# Patient Record
Sex: Male | Born: 1945 | Race: White | Hispanic: No | Smoking: Former smoker
Health system: Southern US, Community
[De-identification: ages and names within clinical notes are randomized; demographics above are authoritative.]

## PROBLEM LIST (undated history)

## (undated) DIAGNOSIS — K429 Umbilical hernia without obstruction or gangrene: Secondary | ICD-10-CM

## (undated) DIAGNOSIS — F32A Depression, unspecified: Secondary | ICD-10-CM

## (undated) HISTORY — DX: Umbilical hernia without obstruction or gangrene: K42.9

## (undated) HISTORY — DX: Depression, unspecified: F32.A

---
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
## 1996-07-02 HISTORY — PX: UMBILICAL HERNIA REPAIR: SHX196

## 2000-07-02 HISTORY — PX: OTHER SURGICAL HISTORY: SHX169

## 2001-09-16 ENCOUNTER — Ambulatory Visit (HOSPITAL_COMMUNITY): Admission: RE | Admit: 2001-09-16 | Discharge: 2001-09-16 | Payer: Self-pay | Admitting: Family Medicine

## 2001-09-16 ENCOUNTER — Encounter: Payer: Self-pay | Admitting: Family Medicine

## 2003-10-15 ENCOUNTER — Ambulatory Visit (HOSPITAL_COMMUNITY): Admission: RE | Admit: 2003-10-15 | Discharge: 2003-10-15 | Payer: Self-pay | Admitting: Gastroenterology

## 2003-10-15 ENCOUNTER — Encounter (INDEPENDENT_AMBULATORY_CARE_PROVIDER_SITE_OTHER): Payer: Self-pay | Admitting: *Deleted

## 2009-03-15 ENCOUNTER — Encounter: Admission: RE | Admit: 2009-03-15 | Discharge: 2009-03-15 | Payer: Self-pay | Admitting: General Surgery

## 2009-04-15 HISTORY — PX: INGUINAL HERNIA REPAIR: SUR1180

## 2010-07-02 HISTORY — PX: CATARACT EXTRACTION, BILATERAL: SHX1313

## 2010-11-17 NOTE — Op Note (Signed)
NAME:  George Paul                         ACCOUNT NO.:  [id number]   MEDICAL RECORD NO.:  [id number]                   PATIENT TYPE:  AMB   LOCATION:  ENDO                                 FACILITY:  [facility]   PHYSICIAN:  Graylin Shiver, M.D.                DATE OF BIRTH:  02-06-1946   DATE OF PROCEDURE:  10/15/2003  DATE OF DISCHARGE:                                 OPERATIVE REPORT   PROCEDURE:  Colonoscopy.   INDICATIONS FOR PROCEDURE:  Screening.   Informed consent was obtained after explanation of the risks of bleeding,  infection, and perforation.   PREMEDICATION:  Fentanyl 50 mcg IV, Versed 6 mg IV.   PROCEDURE:  With the patient in the left lateral decubitus position, a  rectal exam was performed.  No masses were felt.  The Olympus colonoscope  was inserted into the rectum and advanced around the colon to the cecum.  Cecal landmarks were identified.  The cecum and ascending colon were normal.  The transverse colon normal.  The descending colon and sigmoid were normal.  The rectum showed a small 4 mm sessile polyp biopsied off with cold forceps.  He tolerated the procedure well without complications.   IMPRESSION:  Small rectal polyp.   PLAN:  The pathology will be checked.                                               Graylin Shiver, M.D.    Germain Osgood  D:  10/15/2003  T:  10/15/2003  Job:  [id number]   cc:   Meredith Staggers, M.D.  [address], [address]  [place]  [place] [address]  Fax: [phone]

## 2011-01-09 ENCOUNTER — Other Ambulatory Visit (INDEPENDENT_AMBULATORY_CARE_PROVIDER_SITE_OTHER): Payer: Self-pay | Admitting: General Surgery

## 2011-01-09 ENCOUNTER — Encounter (HOSPITAL_COMMUNITY): Payer: Federal, State, Local not specified - PPO

## 2011-01-09 LAB — SURGICAL PCR SCREEN
MRSA, PCR: NEGATIVE
Staphylococcus aureus: NEGATIVE

## 2011-01-18 ENCOUNTER — Ambulatory Visit (HOSPITAL_COMMUNITY)
Admission: RE | Admit: 2011-01-18 | Discharge: 2011-01-18 | Disposition: A | Discharge status: Home / Self Care | Payer: Federal, State, Local not specified - PPO | Source: Ambulatory Visit | Attending: General Surgery | Admitting: General Surgery

## 2011-01-18 DIAGNOSIS — Z01812 Encounter for preprocedural laboratory examination: Secondary | ICD-10-CM | POA: Insufficient documentation

## 2011-01-18 DIAGNOSIS — K4090 Unilateral inguinal hernia, without obstruction or gangrene, not specified as recurrent: Secondary | ICD-10-CM

## 2011-01-18 HISTORY — PX: INGUINAL HERNIA REPAIR: SUR1180

## 2011-02-02 NOTE — Op Note (Signed)
NAMEKONRAD, DESAI            ACCOUNT NO.:  [id number]  MEDICAL RECORD NO.:  [id number]  LOCATION:  DAYL                         FACILITY:  [facility]  PHYSICIAN:  Mary Sella. Andrey Campanile, MD     DATE OF BIRTH:  05/28/1946  DATE OF PROCEDURE:  01/18/2011 DATE OF DISCHARGE:                              OPERATIVE REPORT   PREOPERATIVE DIAGNOSIS:  Right inguinal hernia.  POSTOPERATIVE DIAGNOSIS:  Right indirect inguinal hernia.  PROCEDURE:  Open repair of right indirect inguinal hernia with mesh.  SURGEON:  Mary Sella. Andrey Campanile, MD  ANESTHESIA:  General with LMA plus 30 cc of Exparel.  FINDINGS:  The patient had an indirect hernia.  The sac was reduced.  I used a 6 inch x 6 inch piece of Ultrapro mesh that was trimmed slightly in an overlay fashion.  ESTIMATED BLOOD LOSS:  Minimal.  INDICATIONS FOR PROCEDURE:  The patient is a gentleman that I repaired a left inguinal hernia on in 2010.  He came in to the office several months ago complaining of intermittent bulge in his right groin for several weeks.  The bulge was more noticeable after he stood on his feet while working.  He elected for surgical repair.  We discussed the risks and benefits of surgery including but not limited to bleeding, infection, injury to surrounding structures, nerve entrapment, urinary retention, hernia recurrence, hematoma or seroma formation, urinary retention, chronic inguinal pain, mesh complications and need for additional procedures.  The patient would like to proceed with surgery.  DESCRIPTION OF PROCEDURE:  The patient's right groin was marked in the holding bay with the patient confirming the operative site.  He was then taken to the operating room where general LMA anesthesia was established.  His lower abdomen and groins were prepped and draped in usual standard surgical fashion.  He received Ancef prior to skin incision.  A surgical time-out was performed.  His abdomen was prepped with ChloraPrep.  I  drew a 6 cm skin incision in his right groin in an oblique fashion starting 1 fingerbreadth above the pubic tubercle and extending in an oblique fashion laterally towards the anterior superior iliac spine matching his contralateral incision.  I injected Exparel in the deep dermis, then incised with the #10 blade.  The subcutaneous tissue was divided with electrocautery.  Scarpa fascia was divided with electrocautery.  The external oblique aponeurosis was exposed.  I found the defect in the ring medially.  I then made a small slit  in the external oblique aponeurosis, then I opened up this with metz  Hemostats were placed on each edge of the external oblique aponeurosis.  I placed a Gelpi underneath the external oblique aponeurosis to aid with retraction.  I lifted up the external oblique aponeurosis off the internal oblique superiorly.  This was also carried medially.  I identified the ilioinguinal nerve.  It was dissected off the external oblique aponeurosis and preserved.  Then using my index finger, I lifted up the cord structures and isolated them with a Penrose drain.  There was no evidence of a direct defect.  The inguinal floor was intact.  I then started inspecting the cord contents.  The testicular vessels and vas  deferens were identified and separated from the indirect hernia sac.  I stripped some of the surrounding cremasteric fibers away with both electrocautery as well as blunt dissection.  The indirect sac was isolated from the surrounding cord structures.  Once it was reduced all the way down to the ring, it was reduced back into the abdominal cavity.  At this point, I obtained a piece of Ethicon Ultrapro 6 inch x 6 inch mesh, trimmed it slightly so that it would form an apex medially.  I then sutured the medial aspect of the mesh to the pubic tubercle and then ran out a running suture inferior and lateral between the mesh and the shelving edge of the inguinal ligament.   This was carried out laterally.  I then placed interrupted 2-0 Prolene sutures thru  the mesh to the conjoint tendon medially and then to the internal oblique musculature superior and laterally.  I made a slit in the mesh to accommodate the cord structures.  The tails of the mesh were trimmed slightly and tucked underneath the external oblique aponeurosis.  I then reapproximated the tails of the ring and sutured it to the shelving edge of the inguinal ligament.  The mesh was well seated.  The wound was irrigated.  There was one area in the internal oblique musculature that had a little bit of active ooze.  I therefore used a figure-of-eight 3-0 Vicryl suture for hemostasis and there is no additional oozing.  I then injected some Exparel in the conjoint tendon and internal oblique musculature.  The external oblique aponeurosis was then reapproximated with running 2-0 Vicryl.  Scarpa fascia was then reapproximated with inverted interrupted 3-0 Vicryl sutures.  Additional Exparel was placed in the subcutaneous tissue.  The skin was reapproximated with running 4- 0 Monocryl in a subcuticular fashion followed by application of Dermabond.  All needle, instrument, and sponge counts were correct x2. There were no immediate complications.  The patient was extubated and taken to the recovery room in stable condition.     Mary Sella. Andrey Campanile, MD     EMW/MEDQ  D:  01/18/2011  T:  01/18/2011  Job:  [id number]  cc:   Tally Joe, M.D. Fax: [phone]  Electronically Signed by Gaynelle Adu M.D. on 02/02/2011 01:22:12 PM

## 2011-02-06 ENCOUNTER — Encounter (INDEPENDENT_AMBULATORY_CARE_PROVIDER_SITE_OTHER): Payer: Self-pay | Admitting: General Surgery

## 2011-02-07 ENCOUNTER — Encounter (INDEPENDENT_AMBULATORY_CARE_PROVIDER_SITE_OTHER): Payer: Self-pay | Admitting: General Surgery

## 2011-02-07 ENCOUNTER — Ambulatory Visit (INDEPENDENT_AMBULATORY_CARE_PROVIDER_SITE_OTHER): Payer: Federal, State, Local not specified - PPO | Admitting: General Surgery

## 2011-02-07 DIAGNOSIS — Z9889 Other specified postprocedural states: Secondary | ICD-10-CM

## 2011-02-07 DIAGNOSIS — Z8719 Personal history of other diseases of the digestive system: Secondary | ICD-10-CM

## 2011-02-07 DIAGNOSIS — Z09 Encounter for follow-up examination after completed treatment for conditions other than malignant neoplasm: Secondary | ICD-10-CM

## 2011-02-07 NOTE — Progress Notes (Signed)
Chief complaint: Postop  Procedure: Open right inguinal indirect hernia repair with mesh January 18, 2011  History of Present Ilness: 64 year old Caucasian male comes in today for his first postoperative visit. He states that he did well after surgery. He took the narcotics for several days. He mainly had discomfort on his right flank. He denies any fevers or chills. He did have one or 2 days of constipation. But after taking some MiraLax he had a bowel movement. He denies any numbness or tingling in his groin. He denies any problems with his incision. He denies any trouble urinating. He reports a good appetite. He denies any ongoing discomfort. He is no longer taking any narcotics.  Physical Exam: Well-developed well-nourished Caucasian male in no apparent distress Pulmonary-lungs clear to auscultation Cardiac-regular rate and rhythm Abdomen-soft, nontender, nondistended. GU-well healed right inguinal incision. No signs of cellulitis, induration, or hematoma. Left groin incision is well healed. Both testicles are descended. No scrotal masses. No signs of hernia recurrence.  Data reviewed: I reviewed my operative note from January 18, 2011.  He had an indirect hernia that we repaired using ultra Pro mesh  Assessment and plan: Status post open right indirect inguinal hernia repair with mesh doing well.  I think the patient is doing very well. He is several weeks out from surgery. I will return to full activities on September 4 which will be around 6 weeks after surgery.  I advised him not to lift anything greater than 15 pounds until after September 4.  I will see him in 6 weeks.

## 2011-02-07 NOTE — Patient Instructions (Signed)
Can resume full activities on Sept 4 2012

## 2011-03-21 ENCOUNTER — Ambulatory Visit (INDEPENDENT_AMBULATORY_CARE_PROVIDER_SITE_OTHER): Payer: Federal, State, Local not specified - PPO | Admitting: General Surgery

## 2011-03-21 ENCOUNTER — Encounter (INDEPENDENT_AMBULATORY_CARE_PROVIDER_SITE_OTHER): Payer: Self-pay | Admitting: General Surgery

## 2011-03-21 VITALS — BP 110/70 | HR 76 | Temp 97.9°F | Ht 73.0 in | Wt 168.0 lb

## 2011-03-21 DIAGNOSIS — Z09 Encounter for follow-up examination after completed treatment for conditions other than malignant neoplasm: Secondary | ICD-10-CM

## 2011-03-21 NOTE — Patient Instructions (Signed)
Call us if needed

## 2011-03-21 NOTE — Progress Notes (Signed)
Chief complaint: Postop  Procedure: Open right inguinal indirect hernia repair with mesh January 18, 2011  History of Present Ilness: 65 year old Caucasian male comes in today for his second postoperative visit. He states that he has been doing well.  He has returned to work. He denies any numbness or tingling in his groin. He denies any problems with his incision. He denies any trouble urinating. He reports a good appetite. He denies any ongoing discomfort. He is no longer taking any narcotics.  Physical Exam: Well-developed well-nourished Caucasian male in no apparent distress Pulmonary-lungs clear to auscultation Cardiac-regular rate and rhythm Abdomen-soft, nontender, nondistended. GU-well healed right inguinal incision. No signs of cellulitis, induration, or hematoma. Old Left groin incision is well healed. Both testicles are descended. No scrotal masses. No signs of hernia recurrence.  Data reviewed: I reviewed my operative note from July 19 and Feb 07 2011.  He had an indirect hernia that we repaired using ultra Pro mesh  Assessment and plan: Status post open right indirect inguinal hernia repair with mesh doing well.  I think the patient is doing very well. He  Has returned to full activities. I will see him on a prn basis

## 2011-10-18 ENCOUNTER — Encounter (INDEPENDENT_AMBULATORY_CARE_PROVIDER_SITE_OTHER): Payer: Federal, State, Local not specified - PPO | Admitting: Ophthalmology

## 2011-10-29 ENCOUNTER — Encounter (INDEPENDENT_AMBULATORY_CARE_PROVIDER_SITE_OTHER): Payer: Federal, State, Local not specified - PPO | Admitting: Ophthalmology

## 2011-10-29 DIAGNOSIS — H538 Other visual disturbances: Secondary | ICD-10-CM

## 2011-10-29 DIAGNOSIS — H26499 Other secondary cataract, unspecified eye: Secondary | ICD-10-CM

## 2011-10-29 DIAGNOSIS — H43819 Vitreous degeneration, unspecified eye: Secondary | ICD-10-CM

## 2012-04-30 ENCOUNTER — Ambulatory Visit (INDEPENDENT_AMBULATORY_CARE_PROVIDER_SITE_OTHER): Payer: Federal, State, Local not specified - PPO | Admitting: Ophthalmology

## 2012-04-30 DIAGNOSIS — H2700 Aphakia, unspecified eye: Secondary | ICD-10-CM

## 2012-04-30 DIAGNOSIS — H538 Other visual disturbances: Secondary | ICD-10-CM

## 2012-04-30 DIAGNOSIS — H4300 Vitreous prolapse, unspecified eye: Secondary | ICD-10-CM

## 2012-04-30 DIAGNOSIS — H43819 Vitreous degeneration, unspecified eye: Secondary | ICD-10-CM

## 2013-05-05 ENCOUNTER — Ambulatory Visit (INDEPENDENT_AMBULATORY_CARE_PROVIDER_SITE_OTHER): Payer: Federal, State, Local not specified - PPO | Admitting: Ophthalmology

## 2013-05-05 DIAGNOSIS — H4300 Vitreous prolapse, unspecified eye: Secondary | ICD-10-CM

## 2013-05-05 DIAGNOSIS — H538 Other visual disturbances: Secondary | ICD-10-CM

## 2013-05-05 DIAGNOSIS — H43819 Vitreous degeneration, unspecified eye: Secondary | ICD-10-CM

## 2013-10-10 ENCOUNTER — Ambulatory Visit: Payer: Federal, State, Local not specified - PPO

## 2013-10-10 ENCOUNTER — Ambulatory Visit (INDEPENDENT_AMBULATORY_CARE_PROVIDER_SITE_OTHER): Payer: Federal, State, Local not specified - PPO | Admitting: Physician Assistant

## 2013-10-10 VITALS — BP 132/64 | HR 69 | Temp 100.7°F | Resp 18 | Ht 71.0 in | Wt 175.0 lb

## 2013-10-10 DIAGNOSIS — J111 Influenza due to unidentified influenza virus with other respiratory manifestations: Secondary | ICD-10-CM

## 2013-10-10 DIAGNOSIS — J101 Influenza due to other identified influenza virus with other respiratory manifestations: Secondary | ICD-10-CM

## 2013-10-10 DIAGNOSIS — R0602 Shortness of breath: Secondary | ICD-10-CM

## 2013-10-10 DIAGNOSIS — R059 Cough, unspecified: Secondary | ICD-10-CM

## 2013-10-10 DIAGNOSIS — R05 Cough: Secondary | ICD-10-CM

## 2013-10-10 DIAGNOSIS — R509 Fever, unspecified: Secondary | ICD-10-CM

## 2013-10-10 LAB — POCT CBC
Granulocyte percent: 67.5 %G (ref 37–80)
HCT, POC: 43.9 % (ref 43.5–53.7)
Hemoglobin: 14.4 g/dL (ref 14.1–18.1)
Lymph, poc: 1 (ref 0.6–3.4)
MCH, POC: 30.8 pg (ref 27–31.2)
MCHC: 32.8 g/dL (ref 31.8–35.4)
MCV: 93.9 fL (ref 80–97)
MID (cbc): 0.3 (ref 0–0.9)
MPV: 10.7 fL (ref 0–99.8)
POC Granulocyte: 2.8 (ref 2–6.9)
POC LYMPH PERCENT: 25.6 %L (ref 10–50)
POC MID %: 6.9 %M (ref 0–12)
Platelet Count, POC: 102 10*3/uL — AB (ref 142–424)
RBC: 4.67 M/uL — AB (ref 4.69–6.13)
RDW, POC: 13.5 %
WBC: 4.1 10*3/uL — AB (ref 4.6–10.2)

## 2013-10-10 LAB — POCT INFLUENZA A/B
Influenza A, POC: NEGATIVE
Influenza B, POC: POSITIVE

## 2013-10-10 MED ORDER — HYDROCOD POLST-CHLORPHEN POLST 10-8 MG/5ML PO LQCR: 5.0000 mL | Freq: Two times a day (BID) | ORAL | Status: DC | PRN

## 2013-10-10 MED ORDER — E-Z SPACER DEVI: Status: DC

## 2013-10-10 MED ORDER — ALBUTEROL SULFATE HFA 108 (90 BASE) MCG/ACT IN AERS: 2.0000 | INHALATION_SPRAY | RESPIRATORY_TRACT | Status: DC | PRN

## 2013-10-10 MED ORDER — OSELTAMIVIR PHOSPHATE 75 MG PO CAPS: 75.0000 mg | ORAL_CAPSULE | Freq: Two times a day (BID) | ORAL | Status: DC

## 2013-10-10 NOTE — Patient Instructions (Signed)

## 2013-10-10 NOTE — Progress Notes (Signed)
Subjective:    Patient ID: George Priestly., male    DOB: August 20, 1945, 68 y.o.   MRN: [id number]  HPI Primary Physician: Sissy Hoff, MD  Chief Complaint: Cough and fever x 1 day  HPI: 68 y.o. male with history below presents with 1 day history of cough, nasal congestion, some chest congestion, rhinorrhea, sinus pressure, scratchy throat, fever, chills, fatigue, and myalgias. T max a little over 101 earlier this afternoon. Cough is the main symptom and is mildly productive of yellow sputum, but he has to cough quite hard to get that up. Some SOB. No wheezing. 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 Had a stomach bug earlier in the week, but felt better. No known sick contacts with similar symptoms. Has taken Mucinex and antipyretics. No history of PNA in him. Former smoker. He did receive an influenza vaccine this season. He also has had an pneumonia vaccine within the past year. Here with his wife.    Past Medical History  Diagnosis Date  . Cataract   . Inguinal hernia     bilateral  . Umbilical hernia      Home Meds: Prior to Admission medications   Medication Sig Start Date End Date Taking? Authorizing Provider  eszopiclone (LUNESTA) 2 MG TABS Take 3 mg by mouth at bedtime. Take immediately before bedtime   Yes Historical Provider, MD    Allergies: No Known Allergies  History   Social History  . Marital Status: Married    Spouse Name: N/A    Number of Children: N/A  . Years of Education: N/A   Occupational History  . Not on file.   Social History Main Topics  . Smoking status: Former Games developer  . Smokeless tobacco: Not on file  . Alcohol Use: 0.0 oz/week    3-4 Glasses of wine per week     Comment: socially  . Drug Use: No  . Sexual Activity: Not on file   Other Topics Concern  . Not on file   Social History Narrative  . No narrative on file     Review of Systems  Constitutional: Positive for fever, chills, appetite change and fatigue.       T max at  home "a little over 101." Pushing fluids.   HENT: Positive for congestion, hearing loss, rhinorrhea, sinus pressure, sneezing and sore throat. Negative for ear pain and postnasal drip.        Nasal congestion.  Feels like there is some chest congestion as well .  Scratchy throat.  Muffled hearing. Ears have been popping.  Some sneezing, "not a lot."  Respiratory: Positive for cough and shortness of breath. Negative for wheezing.        Cough is mildly productive of yellowish sputum. Cough is not associated with time of day.   Gastrointestinal: Negative for nausea, vomiting and diarrhea.  Musculoskeletal: Positive for myalgias.  Neurological: Positive for headaches.       Bilateral temples.       Objective:   Physical Exam  Physical Exam: Blood pressure 132/64, pulse 69, temperature 100.7 F (38.2 C), temperature source Oral, resp. rate 18, height 5\' 11"  (1.803 m), weight 175 lb (79.379 kg), SpO2 96.00%., Body mass index is 24.42 kg/(m^2). General: Well developed, well nourished, in no acute distress. Not toxic appearing.  Head: Normocephalic, atraumatic, eyes without discharge, sclera non-icteric, nares are without discharge. Bilateral auditory canals clear, TM's are without perforation, pearly grey and translucent with reflective cone of light  bilaterally. Oral cavity moist, posterior pharynx with post nasal drip. No exudate, erythema, or peritonsillar abscess. Uvula midline.   Neck: Supple. No thyromegaly. Full ROM. No lymphadenopathy. No nuchal rigidity. Lungs: Clear bilaterally to auscultation without wheezes, rales, or rhonchi. Breathing is unlabored. Heart: RRR with S1 S2. No murmurs, rubs, or gallops appreciated. Msk:  Strength and tone normal for age. Extremities/Skin: Warm and dry. No clubbing or cyanosis. No edema. No rashes or suspicious lesions. No erythema. Calves supple.  Neuro: Alert and oriented X 3. Moves all extremities spontaneously. Gait is normal. CNII-XII grossly  in tact. Psych:  Responds to questions appropriately with a normal affect.   CXR:  [facility] reading (PRIMARY) by  Dr. Katrinka Blazing. Negative.  Labs: Results for orders placed in visit on 10/10/13  POCT CBC      Result Value Ref Range   WBC 4.1 (*) 4.6 - 10.2 K/uL   Lymph, poc 1.0  0.6 - 3.4   POC LYMPH PERCENT 25.6  10 - 50 %L   MID (cbc) 0.3  0 - 0.9   POC MID % 6.9  0 - 12 %M   POC Granulocyte 2.8  2 - 6.9   Granulocyte percent 67.5  37 - 80 %G   RBC 4.67 (*) 4.69 - 6.13 M/uL   Hemoglobin 14.4  14.1 - 18.1 g/dL   HCT, POC 40.9  81.1 - 53.7 %   MCV 93.9  80 - 97 fL   MCH, POC 30.8  27 - 31.2 pg   MCHC 32.8  31.8 - 35.4 g/dL   RDW, POC 91.4     Platelet Count, POC 102 (*) 142 - 424 K/uL   MPV 10.7  0 - 99.8 fL  POCT INFLUENZA A/B      Result Value Ref Range   Influenza A, POC Negative     Influenza B, POC Positive        Assessment & Plan:  68 year old male with influenza B, fever, cough, and SOB -Tamiflu 75 mg 1 po bid #10 no RF -Proventil 2 puffs inhaled q 4-6 hours prn #1 no RF -Tussionex 1 tsp po q 12 hours prn cough #90 mL no RF, SED -Mucinex -Rest/fluids -Follow up mid week if still symptomatic, sooner if worse -RTC precautions   Eula Listen, MHS, PA-C [facility] [address] [place], [place] [address] [phone] [facility] 10/10/2013 6:26 PM

## 2014-05-10 ENCOUNTER — Ambulatory Visit (INDEPENDENT_AMBULATORY_CARE_PROVIDER_SITE_OTHER): Payer: Federal, State, Local not specified - PPO | Admitting: Ophthalmology

## 2014-05-10 DIAGNOSIS — H4302 Vitreous prolapse, left eye: Secondary | ICD-10-CM

## 2014-05-10 DIAGNOSIS — H43813 Vitreous degeneration, bilateral: Secondary | ICD-10-CM

## 2014-05-10 DIAGNOSIS — H3509 Other intraretinal microvascular abnormalities: Secondary | ICD-10-CM

## 2015-05-17 ENCOUNTER — Ambulatory Visit (INDEPENDENT_AMBULATORY_CARE_PROVIDER_SITE_OTHER): Payer: Federal, State, Local not specified - PPO | Admitting: Ophthalmology

## 2015-05-17 DIAGNOSIS — H3509 Other intraretinal microvascular abnormalities: Secondary | ICD-10-CM | POA: Diagnosis not present

## 2015-05-17 DIAGNOSIS — H538 Other visual disturbances: Secondary | ICD-10-CM

## 2015-05-17 DIAGNOSIS — H4302 Vitreous prolapse, left eye: Secondary | ICD-10-CM

## 2015-05-17 DIAGNOSIS — H43813 Vitreous degeneration, bilateral: Secondary | ICD-10-CM | POA: Diagnosis not present

## 2016-05-17 ENCOUNTER — Ambulatory Visit (INDEPENDENT_AMBULATORY_CARE_PROVIDER_SITE_OTHER): Payer: Federal, State, Local not specified - PPO | Admitting: Ophthalmology

## 2016-05-17 DIAGNOSIS — H538 Other visual disturbances: Secondary | ICD-10-CM | POA: Diagnosis not present

## 2016-05-17 DIAGNOSIS — H43813 Vitreous degeneration, bilateral: Secondary | ICD-10-CM

## 2017-05-17 ENCOUNTER — Ambulatory Visit (INDEPENDENT_AMBULATORY_CARE_PROVIDER_SITE_OTHER): Payer: Federal, State, Local not specified - PPO | Admitting: Ophthalmology

## 2017-05-20 ENCOUNTER — Ambulatory Visit (INDEPENDENT_AMBULATORY_CARE_PROVIDER_SITE_OTHER): Payer: Federal, State, Local not specified - PPO | Admitting: Ophthalmology

## 2017-05-20 DIAGNOSIS — H353111 Nonexudative age-related macular degeneration, right eye, early dry stage: Secondary | ICD-10-CM

## 2017-05-20 DIAGNOSIS — H43813 Vitreous degeneration, bilateral: Secondary | ICD-10-CM | POA: Diagnosis not present

## 2018-05-21 ENCOUNTER — Encounter (INDEPENDENT_AMBULATORY_CARE_PROVIDER_SITE_OTHER): Payer: Federal, State, Local not specified - PPO | Admitting: Ophthalmology

## 2018-05-21 DIAGNOSIS — H353131 Nonexudative age-related macular degeneration, bilateral, early dry stage: Secondary | ICD-10-CM

## 2018-05-21 DIAGNOSIS — H43813 Vitreous degeneration, bilateral: Secondary | ICD-10-CM

## 2019-03-05 ENCOUNTER — Other Ambulatory Visit: Payer: Self-pay

## 2019-03-05 DIAGNOSIS — Z20822 Contact with and (suspected) exposure to covid-19: Secondary | ICD-10-CM

## 2019-03-06 LAB — NOVEL CORONAVIRUS, NAA: SARS-CoV-2, NAA: NOT DETECTED

## 2019-05-25 ENCOUNTER — Encounter (INDEPENDENT_AMBULATORY_CARE_PROVIDER_SITE_OTHER): Payer: Federal, State, Local not specified - PPO | Admitting: Ophthalmology

## 2019-05-25 ENCOUNTER — Other Ambulatory Visit: Payer: Self-pay

## 2019-05-25 DIAGNOSIS — H353131 Nonexudative age-related macular degeneration, bilateral, early dry stage: Secondary | ICD-10-CM | POA: Diagnosis not present

## 2019-05-25 DIAGNOSIS — H43813 Vitreous degeneration, bilateral: Secondary | ICD-10-CM | POA: Diagnosis not present

## 2019-05-25 DIAGNOSIS — H2513 Age-related nuclear cataract, bilateral: Secondary | ICD-10-CM

## 2019-06-22 ENCOUNTER — Emergency Department (HOSPITAL_COMMUNITY): Payer: Medicare Other

## 2019-06-22 ENCOUNTER — Inpatient Hospital Stay (HOSPITAL_COMMUNITY)
Admission: EM | Admit: 2019-06-22 | Discharge: 2019-06-25 | DRG: 193 | Disposition: A | Discharge status: Home / Self Care | Payer: Medicare Other | Attending: Family Medicine | Admitting: Family Medicine

## 2019-06-22 ENCOUNTER — Encounter (HOSPITAL_COMMUNITY): Payer: Self-pay

## 2019-06-22 DIAGNOSIS — R059 Cough, unspecified: Secondary | ICD-10-CM

## 2019-06-22 DIAGNOSIS — J189 Pneumonia, unspecified organism: Secondary | ICD-10-CM | POA: Diagnosis not present

## 2019-06-22 DIAGNOSIS — R509 Fever, unspecified: Secondary | ICD-10-CM

## 2019-06-22 DIAGNOSIS — R05 Cough: Secondary | ICD-10-CM | POA: Diagnosis present

## 2019-06-22 DIAGNOSIS — G4700 Insomnia, unspecified: Secondary | ICD-10-CM | POA: Diagnosis present

## 2019-06-22 DIAGNOSIS — Z79899 Other long term (current) drug therapy: Secondary | ICD-10-CM

## 2019-06-22 DIAGNOSIS — Z20828 Contact with and (suspected) exposure to other viral communicable diseases: Secondary | ICD-10-CM | POA: Diagnosis present

## 2019-06-22 DIAGNOSIS — R0602 Shortness of breath: Secondary | ICD-10-CM

## 2019-06-22 DIAGNOSIS — J9601 Acute respiratory failure with hypoxia: Secondary | ICD-10-CM | POA: Diagnosis present

## 2019-06-22 DIAGNOSIS — Z87891 Personal history of nicotine dependence: Secondary | ICD-10-CM

## 2019-06-22 LAB — CBC WITH DIFFERENTIAL/PLATELET
Abs Immature Granulocytes: 0.17 10*3/uL — ABNORMAL HIGH (ref 0.00–0.07)
Basophils Absolute: 0 10*3/uL (ref 0.0–0.1)
Basophils Relative: 0 %
Eosinophils Absolute: 0.1 10*3/uL (ref 0.0–0.5)
Eosinophils Relative: 1 %
HCT: 48.2 % (ref 39.0–52.0)
Hemoglobin: 16.1 g/dL (ref 13.0–17.0)
Immature Granulocytes: 1 %
Lymphocytes Relative: 2 %
Lymphs Abs: 0.4 10*3/uL — ABNORMAL LOW (ref 0.7–4.0)
MCH: 31.3 pg (ref 26.0–34.0)
MCHC: 33.4 g/dL (ref 30.0–36.0)
MCV: 93.6 fL (ref 80.0–100.0)
Monocytes Absolute: 1.7 10*3/uL — ABNORMAL HIGH (ref 0.1–1.0)
Monocytes Relative: 7 %
Neutro Abs: 20.1 10*3/uL — ABNORMAL HIGH (ref 1.7–7.7)
Neutrophils Relative %: 89 %
Platelets: 141 10*3/uL — ABNORMAL LOW (ref 150–400)
RBC: 5.15 MIL/uL (ref 4.22–5.81)
RDW: 12.7 % (ref 11.5–15.5)
WBC: 22.6 10*3/uL — ABNORMAL HIGH (ref 4.0–10.5)
nRBC: 0 % (ref 0.0–0.2)

## 2019-06-22 LAB — COMPREHENSIVE METABOLIC PANEL
ALT: 16 U/L (ref 0–44)
AST: 26 U/L (ref 15–41)
Albumin: 4.3 g/dL (ref 3.5–5.0)
Alkaline Phosphatase: 101 U/L (ref 38–126)
Anion gap: 10 (ref 5–15)
BUN: 14 mg/dL (ref 8–23)
CO2: 23 mmol/L (ref 22–32)
Calcium: 9.3 mg/dL (ref 8.9–10.3)
Chloride: 104 mmol/L (ref 98–111)
Creatinine, Ser: 0.86 mg/dL (ref 0.61–1.24)
GFR calc Af Amer: >60 mL/min (ref >60)
GFR calc non Af Amer: >60 mL/min (ref >60)
Glucose, Bld: 138 mg/dL — ABNORMAL HIGH (ref 70–99)
Potassium: 3.8 mmol/L (ref 3.5–5.1)
Sodium: 137 mmol/L (ref 135–145)
Total Bilirubin: 2.3 mg/dL — ABNORMAL HIGH (ref 0.3–1.2)
Total Protein: 7.2 g/dL (ref 6.5–8.1)

## 2019-06-22 LAB — LACTIC ACID, PLASMA: Lactic Acid, Venous: 1.6 mmol/L (ref 0.5–1.9)

## 2019-06-22 LAB — LIPASE, BLOOD: Lipase: 28 U/L (ref 11–51)

## 2019-06-22 LAB — POC SARS CORONAVIRUS 2 AG -  ED: SARS Coronavirus 2 Ag: NEGATIVE

## 2019-06-22 LAB — TROPONIN I (HIGH SENSITIVITY): Troponin I (High Sensitivity): 8 ng/L (ref <18)

## 2019-06-22 NOTE — ED Triage Notes (Signed)
Pt states his fever and body aches started about two hours ago after work, EMS gave 1000mg  tylenol, fever was 104 at home

## 2019-06-22 NOTE — ED Triage Notes (Signed)
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

## 2019-06-22 NOTE — ED Provider Notes (Signed)
[facility]-EMERGENCY DEPT Provider Note   CSN: [id number] Arrival date & time: 06/22/19  2100     History No chief complaint on file. SOB  George Priestlyarnest N Veley Jr. is a 73 y.o. male.  The history is provided by the patient and medical records. No language interpreter was used.  Cough Cough characteristics:  Non-productive Severity:  Severe Onset quality:  Gradual Duration:  3 days Timing:  Constant Progression:  Worsening Chronicity:  New Relieved by:  Nothing Worsened by:  Nothing Ineffective treatments:  None tried Associated symptoms: chills, fever, myalgias and shortness of breath   Associated symptoms: no chest pain, no diaphoresis, no headaches, no rash and no wheezing   Fever:    Timing:  Constant   Max temp PTA:  104   Temp source:  Oral   Progression:  Waxing and waning      Past Medical History:  Diagnosis Date  . Cataract   . Inguinal hernia    bilateral  . Umbilical hernia     There are no problems to display for this patient.   Past Surgical History:  Procedure Laterality Date  . arm surgery  2002  . CATARACT EXTRACTION, BILATERAL  2012  . EYE SURGERY  1964  . INGUINAL HERNIA REPAIR  04/15/09   left indirect  . INGUINAL HERNIA REPAIR  01/18/11   right indirect  . TONSILLECTOMY  1959  . UMBILICAL HERNIA REPAIR  1998       Family History  Problem Relation Age of Onset  . Heart disease Mother     Social History   Tobacco Use  . Smoking status: Former Games developermoker  . Smokeless tobacco: Never Used  Substance Use Topics  . Alcohol use: Yes    Alcohol/week: 3.0 - 4.0 standard drinks    Types: 3 - 4 Glasses of wine per week    Comment: socially  . Drug use: No    Home Medications Prior to Admission medications   Medication Sig Start Date End Date Taking? Authorizing Provider  albuterol (PROVENTIL HFA;VENTOLIN HFA) 108 (90 BASE) MCG/ACT inhaler Inhale 2 puffs into the lungs every 4 (four) hours as needed for wheezing  or shortness of breath. 10/10/13   Dunn, Raymon Muttonyan M, PA-C  chlorpheniramine-HYDROcodone (TUSSIONEX PENNKINETIC ER) 10-8 MG/5ML LQCR Take 5 mLs by mouth every 12 (twelve) hours as needed for cough. 10/10/13   Dunn, Raymon Muttonyan M, PA-C  eszopiclone (LUNESTA) 2 MG TABS Take 3 mg by mouth at bedtime. Take immediately before bedtime    [provider]  oseltamivir (TAMIFLU) 75 MG capsule Take 1 capsule (75 mg total) by mouth 2 (two) times daily. 10/10/13   Sondra Bargesunn, Ryan M, PA-C  Spacer/Aero-Holding Chambers (E-Z SPACER) inhaler Use as instructed 10/10/13   Sondra Bargesunn, Ryan M, PA-C    Allergies    Patient has no known allergies.  Review of Systems   Review of Systems  Constitutional: Positive for chills, fatigue and fever. Negative for diaphoresis.  HENT: Positive for congestion.   Respiratory: Positive for cough and shortness of breath. Negative for chest tightness, wheezing and stridor.   Cardiovascular: Negative for chest pain, palpitations and leg swelling.  Gastrointestinal: Positive for nausea. Negative for abdominal pain, diarrhea and vomiting.  Genitourinary: Negative for flank pain and frequency.  Musculoskeletal: Positive for myalgias. Negative for back pain and neck stiffness.  Skin: Negative for rash and wound.  Neurological: Negative for dizziness, light-headedness and headaches.  Psychiatric/Behavioral: Negative for agitation.  All other systems  reviewed and are negative.   Physical Exam Updated Vital Signs BP 138/88 (BP Location: Left Arm)   Pulse (!) 115   Temp 99.9 F (37.7 C) (Oral)   Resp 18   Ht 5\' 11"  (1.803 m)   Wt 86.2 kg   SpO2 92%   BMI 26.50 kg/m   Physical Exam Vitals and nursing note reviewed.  Constitutional:      General: He is not in acute distress.    Appearance: He is well-developed. He is ill-appearing. He is not toxic-appearing or diaphoretic.  HENT:     Head: Normocephalic and atraumatic.     Nose: Congestion present. No rhinorrhea.     Mouth/Throat:      Mouth: Mucous membranes are moist.  Eyes:     Conjunctiva/sclera: Conjunctivae normal.     Pupils: Pupils are equal, round, and reactive to light.  Cardiovascular:     Rate and Rhythm: Regular rhythm. Tachycardia present.     Pulses: Normal pulses.     Heart sounds: No murmur.  Pulmonary:     Effort: No respiratory distress.     Breath sounds: No stridor. Rhonchi present. No wheezing or rales.  Chest:     Chest wall: No tenderness.  Abdominal:     General: Abdomen is flat.     Palpations: Abdomen is soft.     Tenderness: There is no abdominal tenderness. There is no right CVA tenderness or left CVA tenderness.  Musculoskeletal:        General: No tenderness.     Cervical back: Neck supple. No tenderness.     Right lower leg: No edema.     Left lower leg: No edema.  Skin:    General: Skin is warm and dry.     Capillary Refill: Capillary refill takes less than 2 seconds.     Findings: No erythema.  Neurological:     General: No focal deficit present.     Mental Status: He is alert.     ED Results / Procedures / Treatments   Labs (all labs ordered are listed, but only abnormal results are displayed) Labs Reviewed  CBC WITH DIFFERENTIAL/PLATELET - Abnormal; Notable for the following components:      Result Value   WBC 22.6 (*)    Platelets 141 (*)    Neutro Abs 20.1 (*)    Lymphs Abs 0.4 (*)    Monocytes Absolute 1.7 (*)    Abs Immature Granulocytes 0.17 (*)    All other components within normal limits  COMPREHENSIVE METABOLIC PANEL - Abnormal; Notable for the following components:   Glucose, Bld 138 (*)    Total Bilirubin 2.3 (*)    All other components within normal limits  URINALYSIS, ROUTINE W REFLEX MICROSCOPIC - Abnormal; Notable for the following components:   Ketones, ur 20 (*)    All other components within normal limits  CULTURE, BLOOD (ROUTINE X 2)  CULTURE, BLOOD (ROUTINE X 2)  SARS CORONAVIRUS 2 (TAT 6-24 HRS)  URINE CULTURE  LACTIC ACID, PLASMA    LACTIC ACID, PLASMA  LIPASE, BLOOD  D-DIMER, QUANTITATIVE (NOT AT [facility])  POC SARS CORONAVIRUS 2 AG -  ED  TROPONIN I (HIGH SENSITIVITY)  TROPONIN I (HIGH SENSITIVITY)    EKG None  Radiology CT Angio Chest PE W and/or Wo Contrast  Result Date: 06/23/2019 CLINICAL DATA:  Shortness of breath, fever, body aches, leukocytosis, negative point of care COVID-19 testing EXAM: CT ANGIOGRAPHY CHEST WITH CONTRAST TECHNIQUE: Multidetector CT imaging  of the chest was performed using the standard protocol during bolus administration of intravenous contrast. Multiplanar CT image reconstructions and MIPs were obtained to evaluate the vascular anatomy. CONTRAST:  142mL OMNIPAQUE IOHEXOL 350 MG/ML SOLN COMPARISON:  Chest radiograph 06/22/2019 FINDINGS: Cardiovascular: Satisfactory opacification the pulmonary arteries. Evaluation beyond the lobar level is complicated by extensive respiratory motion artifact. No pulmonary artery filling defects are identified within the central or lobar pulmonary arteries. Central pulmonary arteries are borderline enlarged. There is mild cardiomegaly with predominantly right heart enlargement. Scant coronary calcification is present. Atherosclerotic plaque within the normal caliber aorta. Normal 3 vessel branching of the arch. Minimal calcification in the proximal left subclavian artery. Major venous structures are unremarkable. Mediastinum/Nodes: There is conglomerate nodal tissue in the subcarinal region extending to the right hilum which is low-attenuation a does measure up to 14 mm short axis (4/86). No acute abnormality of the esophagus. Bowing of the posterior trachea likely related to imaging during exhalation. Thyroid gland and thoracic inlet are unremarkable. Lungs/Pleura: Evaluation of the lung parenchyma is complicated by extensive respiratory motion artifact. There is some patchy airspace disease in the right lower lobe with peripheral ground-glass opacity as well as  peribronchovascular thickening and opacity extending from the right hilum. Several of the right lower lobe airways terminate in likely mucous filled bronchi. Additional atelectatic changes noted in the lung bases. No pneumothorax. No visible effusion. Upper Abdomen: A 9 mm hyperdense focus in the dome of the liver (4/99) is too small to fully characterize on CT imaging but statistically likely benign. No acute abnormalities present in the visualized portions of the upper abdomen. Musculoskeletal: Multilevel degenerative changes are present in the imaged portions of the spine. No acute osseous abnormality or suspicious osseous lesion. Review of the MIP images confirms the above findings. IMPRESSION: 1. Study is complicated by extensive respiratory motion artifact. 2. No evidence of pulmonary embolus within the central or lobar pulmonary arteries. 3. Airspace disease in the right lower lobe with peripheral ground-glass opacity as well as peribronchovascular thickening and opacity extending from the right hilum with mucous filled airways. Findings are favored to represent sequela of aspiration versus other acute infectious/inflammatory process, however, follow-up imaging after treatment of the acute symptoms is recommended to document resolution and exclude underlying malignancy. 4. Right hilar and subcarinal adenopathy, likely reactive. 5. 9 mm hyperdense focus in the dome of the liver is too small to fully characterize on CT imaging but statistically likely benign. Continued attention on follow-up imaging is recommended. 6.  Aortic Atherosclerosis (ICD10-I70.0). Electronically Signed   By: Lovena Le M.D.   On: 06/23/2019 02:38   DG Chest Portable 1 View  Result Date: 06/22/2019 CLINICAL DATA:  Shortness of breath, cough and fever EXAM: PORTABLE CHEST 1 VIEW COMPARISON:  Radiograph 06/30/2017 FINDINGS: Low lung volumes with streaky opacities in the bases favoring atelectatic change. No consolidative opacity,  features of edema, pneumothorax or effusion. The aorta is calcified. The remaining cardiomediastinal contours are unremarkable. No acute osseous or soft tissue abnormality. IMPRESSION: 1. Low lung volumes with streaky opacities in the bases favoring atelectasis. No other acute cardiopulmonary abnormality. 2.  Aortic Atherosclerosis (ICD10-I70.0). Electronically Signed   By: Lovena Le M.D.   On: 06/22/2019 23:19    Procedures Procedures (including critical care time)  Medications Ordered in ED Medications  sodium chloride (PF) 0.9 % injection (  Given by Other 06/23/19 0129)  iohexol (OMNIPAQUE) 350 MG/ML injection 100 mL (100 mLs Intravenous Contrast Given 06/23/19 0205)  ED Course  I have reviewed the triage vital signs and the nursing notes.  Pertinent labs & imaging results that were available during my care of the patient were reviewed by me and considered in my medical decision making (see chart for details).    MDM Rules/Calculators/A&P                      Tomi Paddock. is a 73 y.o. male with no significant past medical history who presents with fevers, chills, shortness of breath, nausea, and malaise.  Patient reports that he has "had a cold" for a few days.  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  He reports today he had worsening shortness of breath and cough and was found to have a temperature of 104 by EMS.  Patient was given Tylenol and temperature is improved to 99.9 orally.  Patient was tachycardic on arrival and denied any chest pain.  He does report he is very short of breath and oxygen saturation was around 92% on room air.  Patient increased work of breathing and was placed on oxygen supplementation and is now improved to around 94 to 96% on 2 L.  He is reporting some mild abdominal discomfort but denies any constipation or diarrhea.  No recent trauma.  He has significant cough but no wheezing.  He denies  any other complaints.  No neck pain or neck stiffness.  On exam, lungs are coarse bilaterally.  No significant wheezing.  No murmur.  Patient is good pulses in all extremities and had no significant lower extremity tenderness or edema.  Good pulses.  Chest was nontender.  Abdomen was nontender on my exam.  Clinically I am concerned about COVID-19 infection versus URI versus pneumonia.  With lack of chest pain and the context of his cough and fever, I have lower suspicion for pulmonary embolism at this time.  Will get chest x-ray, labs, and he will be on oxygen.  Anticipate admission for his increased work of breathing and borderline low oxygen saturations at rest.  Rapid covid test is negative but there is still a high suspiscion. Will send 6 hour test. Leukocytosis present.  Other initial labs reassuring.   Given low O2 with his fever, cough, SOB and being on O2 suppplementation, pt will be admitted. UA did not show UTI. Covid vs occult PNA primary concerns. Pt will be admitted for further management.      Final Clinical Impression(s) / ED Diagnoses Final diagnoses:  Shortness of breath  Cough  Fever, unspecified fever cause    Rx / DC Orders ED Discharge Orders    None     Clinical Impression: 1. Shortness of breath   2. Cough   3. Fever, unspecified fever cause     Disposition: Admit  This note was prepared with assistance of Dragon voice recognition software. Occasional wrong-word or sound-a-like substitutions may have occurred due to the inherent limitations of voice recognition software.      Kaydenn Mclear, Canary Brim, MD 06/23/19 [phone]

## 2019-06-23 ENCOUNTER — Observation Stay (HOSPITAL_COMMUNITY): Payer: Medicare Other

## 2019-06-23 ENCOUNTER — Encounter (HOSPITAL_COMMUNITY): Payer: Self-pay | Admitting: Internal Medicine

## 2019-06-23 ENCOUNTER — Other Ambulatory Visit: Payer: Self-pay

## 2019-06-23 DIAGNOSIS — Z20828 Contact with and (suspected) exposure to other viral communicable diseases: Secondary | ICD-10-CM | POA: Diagnosis present

## 2019-06-23 DIAGNOSIS — Z87891 Personal history of nicotine dependence: Secondary | ICD-10-CM | POA: Diagnosis not present

## 2019-06-23 DIAGNOSIS — R0602 Shortness of breath: Secondary | ICD-10-CM

## 2019-06-23 DIAGNOSIS — G4700 Insomnia, unspecified: Secondary | ICD-10-CM | POA: Diagnosis present

## 2019-06-23 DIAGNOSIS — R05 Cough: Secondary | ICD-10-CM | POA: Diagnosis present

## 2019-06-23 DIAGNOSIS — J189 Pneumonia, unspecified organism: Principal | ICD-10-CM

## 2019-06-23 DIAGNOSIS — Z79899 Other long term (current) drug therapy: Secondary | ICD-10-CM | POA: Diagnosis not present

## 2019-06-23 DIAGNOSIS — J9601 Acute respiratory failure with hypoxia: Secondary | ICD-10-CM | POA: Diagnosis present

## 2019-06-23 DIAGNOSIS — R059 Cough, unspecified: Secondary | ICD-10-CM | POA: Diagnosis present

## 2019-06-23 LAB — URINALYSIS, ROUTINE W REFLEX MICROSCOPIC
Bilirubin Urine: NEGATIVE
Glucose, UA: NEGATIVE mg/dL
Hgb urine dipstick: NEGATIVE
Ketones, ur: 20 mg/dL — AB
Leukocytes,Ua: NEGATIVE
Nitrite: NEGATIVE
Protein, ur: NEGATIVE mg/dL
Specific Gravity, Urine: 1.018 (ref 1.005–1.030)
pH: 6 (ref 5.0–8.0)

## 2019-06-23 LAB — SARS CORONAVIRUS 2 (TAT 6-24 HRS): SARS Coronavirus 2: NEGATIVE

## 2019-06-23 LAB — LACTIC ACID, PLASMA: Lactic Acid, Venous: 1.5 mmol/L (ref 0.5–1.9)

## 2019-06-23 LAB — STREP PNEUMONIAE URINARY ANTIGEN: Strep Pneumo Urinary Antigen: POSITIVE — AB

## 2019-06-23 LAB — HIV ANTIBODY (ROUTINE TESTING W REFLEX): HIV Screen 4th Generation wRfx: NONREACTIVE

## 2019-06-23 LAB — D-DIMER, QUANTITATIVE: D-Dimer, Quant: 0.37 ug/mL-FEU (ref 0.00–0.50)

## 2019-06-23 LAB — TROPONIN I (HIGH SENSITIVITY): Troponin I (High Sensitivity): 9 ng/L (ref <18)

## 2019-06-23 MED ORDER — ALBUTEROL SULFATE (2.5 MG/3ML) 0.083% IN NEBU: 2.5000 mg | INHALATION_SOLUTION | RESPIRATORY_TRACT | Status: DC | PRN

## 2019-06-23 MED ORDER — SODIUM CHLORIDE 0.9 % IV SOLN
500.0000 mg | Freq: Every day | INTRAVENOUS | Status: DC
  Administered 2019-06-23 – 2019-06-25 (×3): 500 mg via INTRAVENOUS
  Filled 2019-06-23 (×3): qty 500

## 2019-06-23 MED ORDER — SODIUM CHLORIDE 0.9 % IV SOLN: INTRAVENOUS | Status: AC

## 2019-06-23 MED ORDER — ZOLPIDEM TARTRATE 5 MG PO TABS: 5.0000 mg | ORAL_TABLET | Freq: Every evening | ORAL | Status: DC | PRN

## 2019-06-23 MED ORDER — IOHEXOL 350 MG/ML SOLN
100.0000 mL | Freq: Once | INTRAVENOUS | Status: AC | PRN
  Administered 2019-06-23: 100 mL via INTRAVENOUS

## 2019-06-23 MED ORDER — ALBUTEROL SULFATE HFA 108 (90 BASE) MCG/ACT IN AERS
2.0000 | INHALATION_SPRAY | RESPIRATORY_TRACT | Status: DC | PRN
  Filled 2019-06-23: qty 6.7

## 2019-06-23 MED ORDER — SODIUM CHLORIDE (PF) 0.9 % IJ SOLN
INTRAMUSCULAR | Status: AC
  Filled 2019-06-23: qty 50

## 2019-06-23 MED ORDER — SODIUM CHLORIDE 0.9 % IV SOLN
2.0000 g | Freq: Every day | INTRAVENOUS | Status: DC
  Administered 2019-06-23 – 2019-06-25 (×3): 2 g via INTRAVENOUS
  Filled 2019-06-23: qty 2
  Filled 2019-06-23: qty 20
  Filled 2019-06-23: qty 2

## 2019-06-23 MED ORDER — ENOXAPARIN SODIUM 40 MG/0.4ML ~~LOC~~ SOLN
40.0000 mg | Freq: Every day | SUBCUTANEOUS | Status: DC
  Administered 2019-06-23 – 2019-06-25 (×3): 40 mg via SUBCUTANEOUS
  Filled 2019-06-23 (×3): qty 0.4

## 2019-06-23 NOTE — H&P (Addendum)
TRH H&P    Patient Demographics:    George Paul, is a 73 y.o. male  MRN: [id number]  DOB - 1945/12/16  Admit Date - 06/22/2019  Referring MD/NP/PA: Wilkie Aye  Outpatient Primary MD for the patient is Tally Joe, MD  Patient coming from:  home  Chief complaint-   Fever,    HPI:    George Paul  is a 73 y.o. 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  pt also noted some myalgia.  Pt denies cp, palp, sob, n/v, diarrhea, brbpr, black stool, dysuria , hematuria. Pt notes it has been difficult to urinate over the past few days.   In Ed,  T 99.9, P 115, R 18, Bp 138/88 pox 92% on 2L [place] Wt 86.2kg  CTA chest IMPRESSION: 1. Study is complicated by extensive respiratory motion artifact. 2. No evidence of pulmonary embolus within the central or lobar pulmonary arteries. 3. Airspace disease in the right lower lobe with peripheral ground-glass opacity as well as peribronchovascular thickening and opacity extending from the right hilum with mucous filled airways. Findings are favored to represent sequela of aspiration versus other acute infectious/inflammatory process, however, follow-up imaging after treatment of the acute symptoms is recommended to document resolution and exclude underlying malignancy. 4. Right hilar and subcarinal adenopathy, likely reactive. 5. 9 mm hyperdense focus in the dome of the liver is too small to fully characterize on CT imaging but statistically likely benign. Continued attention on follow-up imaging is recommended. 6.  Aortic Atherosclerosis (ICD10-I70.0).   Wbc 22.6, Hgb 16.1, Plt 141 Na 137, K 3.8,  Bun 14, Creatinine 0.86 Ast 26, Alt 16 Lactic acid 1.6 Trop 8 D dimer 0.37 Urinalysis negative Poc sars negative  Blood culture x2 pending  Pt will be admitted for CAP r/o Covid-19     Review of systems:    In  addition to the HPI above,    No Headache, No changes with Vision or hearing, No problems swallowing food or Liquids, No Chest pain, No Shortness of Breath, No Abdominal pain, No Nausea or Vomiting, bowel movements are regular, No Blood in stool or Urine, No dysuria, No new skin rashes or bruises, No new joints pains-aches,  No new weakness, tingling, numbness in any extremity, No recent weight gain or loss, No polyuria, polydypsia or polyphagia, No significant Mental Stressors.  All other systems reviewed and are negative.    Past History of the following :    Past Medical History:  Diagnosis Date  . Cataract   . Inguinal hernia    bilateral  . Umbilical hernia       Past Surgical History:  Procedure Laterality Date  . arm surgery  2002  . CATARACT EXTRACTION, BILATERAL  2012  . EYE SURGERY  1964  . INGUINAL HERNIA REPAIR  04/15/09   left indirect  . INGUINAL HERNIA REPAIR  01/18/11   right indirect  . TONSILLECTOMY  1959  . UMBILICAL HERNIA REPAIR  1998      Social  History:      Social History   Tobacco Use  . Smoking status: Former Games developer  . Smokeless tobacco: Never Used  Substance Use Topics  . Alcohol use: Yes    Alcohol/week: 3.0 - 4.0 standard drinks    Types: 3 - 4 Glasses of wine per week    Comment: socially       Family History :     Family History  Problem Relation Age of Onset  . Heart disease Mother        Home Medications:   Prior to Admission medications   Medication Sig Start Date End Date Taking? Authorizing Provider  albuterol (PROVENTIL HFA;VENTOLIN HFA) 108 (90 BASE) MCG/ACT inhaler Inhale 2 puffs into the lungs every 4 (four) hours as needed for wheezing or shortness of breath. 10/10/13  Yes Dunn, Raymon Mutton, PA-C  eszopiclone (LUNESTA) 2 MG TABS Take 3 mg by mouth at bedtime. Take immediately before bedtime   Yes [provider]  mirtazapine (REMERON) 15 MG tablet Take 7.5 mg by mouth at bedtime. 04/06/19  Yes  [provider]  chlorpheniramine-HYDROcodone (TUSSIONEX PENNKINETIC ER) 10-8 MG/5ML LQCR Take 5 mLs by mouth every 12 (twelve) hours as needed for cough. Patient not taking: Reported on 06/23/2019 10/10/13   Sondra Barges, PA-C  oseltamivir (TAMIFLU) 75 MG capsule Take 1 capsule (75 mg total) by mouth 2 (two) times daily. Patient not taking: Reported on 06/23/2019 10/10/13   Sondra Barges, PA-C  Spacer/Aero-Holding Chambers (E-Z SPACER) inhaler Use as instructed 10/10/13   Sondra Barges, PA-C     Allergies:    No Known Allergies   Physical Exam:   Vitals  Blood pressure 136/75, pulse 93, temperature 99.9 F (37.7 C), temperature source Oral, resp. rate 20, height 5\' 11"  (1.803 m), weight 86.2 kg, SpO2 97 %.  1.  General: axoxo3  2. Psychiatric: euthymic  3. Neurologic: Cn 2-12 intact, reflexes 2+ symmetric, diffuse with no clonus, motor 5/5 in all 4 ext  4. HEENMT:  Anicteric, pupils 1.30mm symmetric, direct, consensual , near intact Neck: no jvd  5. Respiratory : Crackles right lung base, no wheezing  6. Cardiovascular : rrr s1, s2, no m/g/r  7. Gastrointestinal:  Abd: soft, nt, nd, +bs  8. Skin:  Ext: no c/c/e,  No rash  9.Musculoskeletal:  Good ROM    Data Review:    CBC Recent Labs  Lab 06/22/19 2232  WBC 22.6*  HGB 16.1  HCT 48.2  PLT 141*  MCV 93.6  MCH 31.3  MCHC 33.4  RDW 12.7  LYMPHSABS 0.4*  MONOABS 1.7*  EOSABS 0.1  BASOSABS 0.0   ------------------------------------------------------------------------------------------------------------------  Results for orders placed or performed during the hospital encounter of 06/22/19 (from the past 48 hour(s))  CBC with Differential     Status: Abnormal   Collection Time: 06/22/19 10:32 PM  Result Value Ref Range   WBC 22.6 (H) 4.0 - 10.5 K/uL   RBC 5.15 4.22 - 5.81 MIL/uL   Hemoglobin 16.1 13.0 - 17.0 g/dL   HCT 78.4 69.6 - 29.5 %   MCV 93.6 80.0 - 100.0 fL   MCH 31.3 26.0 - 34.0 pg     MCHC 33.4 30.0 - 36.0 g/dL   RDW 28.4 13.2 - 44.0 %   Platelets 141 (L) 150 - 400 K/uL   nRBC 0.0 0.0 - 0.2 %   Neutrophils Relative % 89 %   Neutro Abs 20.1 (H) 1.7 - 7.7 K/uL  Lymphocytes Relative 2 %   Lymphs Abs 0.4 (L) 0.7 - 4.0 K/uL   Monocytes Relative 7 %   Monocytes Absolute 1.7 (H) 0.1 - 1.0 K/uL   Eosinophils Relative 1 %   Eosinophils Absolute 0.1 0.0 - 0.5 K/uL   Basophils Relative 0 %   Basophils Absolute 0.0 0.0 - 0.1 K/uL   Immature Granulocytes 1 %   Abs Immature Granulocytes 0.17 (H) 0.00 - 0.07 K/uL    Comment: Performed at [facility], [address] [place], [place] [address]  Comprehensive metabolic panel     Status: Abnormal   Collection Time: 06/22/19 10:32 PM  Result Value Ref Range   Sodium 137 135 - 145 mmol/L   Potassium 3.8 3.5 - 5.1 mmol/L   Chloride 104 98 - 111 mmol/L   CO2 23 22 - 32 mmol/L   Glucose, Bld 138 (H) 70 - 99 mg/dL   BUN 14 8 - 23 mg/dL   Creatinine, Ser 6.96 0.61 - 1.24 mg/dL   Calcium 9.3 8.9 - 29.5 mg/dL   Total Protein 7.2 6.5 - 8.1 g/dL   Albumin 4.3 3.5 - 5.0 g/dL   AST 26 15 - 41 U/L   ALT 16 0 - 44 U/L   Alkaline Phosphatase 101 38 - 126 U/L   Total Bilirubin 2.3 (H) 0.3 - 1.2 mg/dL   GFR calc non Af Amer >60 >60 mL/min   GFR calc Af Amer >60 >60 mL/min   Anion gap 10 5 - 15    Comment: Performed at [facility], [address] [place], [place] [address]  Lactic acid, plasma     Status: None   Collection Time: 06/22/19 10:32 PM  Result Value Ref Range   Lactic Acid, Venous 1.6 0.5 - 1.9 mmol/L    Comment: Performed at [facility], [address] [place], [place] [address]  Troponin I (High Sensitivity)     Status: None   Collection Time: 06/22/19 10:32 PM  Result Value Ref Range   Troponin I (High Sensitivity) 8 <18 ng/L    Comment: (NOTE) Elevated high sensitivity troponin I (hsTnI) values and significant  changes across serial measurements may  suggest ACS but many other  chronic and acute conditions are known to elevate hsTnI results.  Refer to the "Links" section for chest pain algorithms and additional  guidance. Performed at [facility], [address], [place], [place] [address]   Lipase, blood     Status: None   Collection Time: 06/22/19 10:32 PM  Result Value Ref Range   Lipase 28 11 - 51 U/L    Comment: Performed at [facility], [address] [place], [place] [address]  POC SARS Coronavirus 2 Ag-ED - Nasal Swab (BD Veritor Kit)     Status: None   Collection Time: 06/22/19 11:22 PM  Result Value Ref Range   SARS Coronavirus 2 Ag NEGATIVE NEGATIVE    Comment: (NOTE) SARS-CoV-2 antigen NOT DETECTED.  Negative results are presumptive.  Negative results do not preclude SARS-CoV-2 infection and should not be used as the sole basis for treatment or other patient management decisions, including infection  control decisions, particularly in the presence of clinical signs and  symptoms consistent with COVID-19, or in those who have been in contact with the virus.  Negative results must be combined with clinical observations, patient history, and epidemiological information. The expected result is Negative. Fact Sheet for Patients: https://sanders-williams.net/  Fact Sheet for Healthcare Providers: https://martinez.com/ This test is not yet approved or cleared by the [place] FDA and  has been authorized for detection and/or diagnosis of SARS-CoV-2 by FDA under an Emergency Use Authorization (EUA).  This EUA will remain in effect (meaning this test can be used) for the duration of  the COVID-19 de claration under Section 564(b)(1) of the Act, 21 U.S.C. section 360bbb-3(b)(1), unless the authorization is terminated or revoked sooner.   Lactic acid, plasma     Status: None   Collection Time: 06/23/19  1:54 AM  Result Value Ref Range   Lactic  Acid, Venous 1.5 0.5 - 1.9 mmol/L    Comment: Performed at [facility], [address], [place], [place] [address]  Troponin I (High Sensitivity)     Status: None   Collection Time: 06/23/19  1:54 AM  Result Value Ref Range   Troponin I (High Sensitivity) 9 <18 ng/L    Comment: (NOTE) Elevated high sensitivity troponin I (hsTnI) values and significant  changes across serial measurements may suggest ACS but many other  chronic and acute conditions are known to elevate hsTnI results.  Refer to the "Links" section for chest pain algorithms and additional  guidance. Performed at [facility], [address] [place], [place] [address]   D-dimer, quantitative (not at [facility])     Status: None   Collection Time: 06/23/19  1:54 AM  Result Value Ref Range   D-Dimer, Quant 0.37 0.00 - 0.50 ug/mL-FEU    Comment: (NOTE) At the manufacturer cut-off of 0.50 ug/mL FEU, this assay has been documented to exclude PE with a sensitivity and negative predictive value of 97 to 99%.  At this time, this assay has not been approved by the FDA to exclude DVT/VTE. Results should be correlated with clinical presentation. Performed at [facility], [address] [place], [place] [address]   Urinalysis, Routine w reflex microscopic     Status: Abnormal   Collection Time: 06/23/19  1:54 AM  Result Value Ref Range   Color, Urine YELLOW YELLOW   APPearance CLEAR CLEAR   Specific Gravity, Urine 1.018 1.005 - 1.030   pH 6.0 5.0 - 8.0   Glucose, UA NEGATIVE NEGATIVE mg/dL   Hgb urine dipstick NEGATIVE NEGATIVE   Bilirubin Urine NEGATIVE NEGATIVE   Ketones, ur 20 (A) NEGATIVE mg/dL   Protein, ur NEGATIVE NEGATIVE mg/dL   Nitrite NEGATIVE NEGATIVE   Leukocytes,Ua NEGATIVE NEGATIVE    Comment: Performed at [facility], [address] [place], [place] [address]    Chemistries  Recent Labs  Lab 06/22/19 2232  NA 137  K 3.8  CL  104  CO2 23  GLUCOSE 138*  BUN 14  CREATININE 0.86  CALCIUM 9.3  AST 26  ALT 16  ALKPHOS 101  BILITOT 2.3*   ------------------------------------------------------------------------------------------------------------------  ------------------------------------------------------------------------------------------------------------------ GFR: Estimated Creatinine Clearance: 82.7 mL/min (by C-G formula based on SCr of 0.86 mg/dL). Liver Function Tests: Recent Labs  Lab 06/22/19 2232  AST 26  ALT 16  ALKPHOS 101  BILITOT 2.3*  PROT 7.2  ALBUMIN 4.3   Recent Labs  Lab 06/22/19 2232  LIPASE 28   No results for input(s): AMMONIA in the last 168 hours. Coagulation Profile: No results for input(s): INR, PROTIME in the last 168 hours. Cardiac Enzymes: No results for input(s): CKTOTAL, CKMB, CKMBINDEX, TROPONINI in the last 168 hours. BNP (last 3 results) No results for input(s): PROBNP in  the last 8760 hours. HbA1C: No results for input(s): HGBA1C in the last 72 hours. CBG: No results for input(s): GLUCAP in the last 168 hours. Lipid Profile: No results for input(s): CHOL, HDL, LDLCALC, TRIG, CHOLHDL, LDLDIRECT in the last 72 hours. Thyroid Function Tests: No results for input(s): TSH, T4TOTAL, FREET4, T3FREE, THYROIDAB in the last 72 hours. Anemia Panel: No results for input(s): VITAMINB12, FOLATE, FERRITIN, TIBC, IRON, RETICCTPCT in the last 72 hours.  --------------------------------------------------------------------------------------------------------------- Urine analysis:    Component Value Date/Time   COLORURINE YELLOW 06/23/2019 0154   APPEARANCEUR CLEAR 06/23/2019 0154   LABSPEC 1.018 06/23/2019 0154   PHURINE 6.0 06/23/2019 0154   GLUCOSEU NEGATIVE 06/23/2019 0154   HGBUR NEGATIVE 06/23/2019 0154   BILIRUBINUR NEGATIVE 06/23/2019 0154   KETONESUR 20 (A) 06/23/2019 0154   PROTEINUR NEGATIVE 06/23/2019 0154   NITRITE NEGATIVE 06/23/2019 0154    LEUKOCYTESUR NEGATIVE 06/23/2019 0154      Imaging Results:    CT Angio Chest PE W and/or Wo Contrast  Result Date: 06/23/2019 CLINICAL DATA:  Shortness of breath, fever, body aches, leukocytosis, negative point of care COVID-19 testing EXAM: CT ANGIOGRAPHY CHEST WITH CONTRAST TECHNIQUE: Multidetector CT imaging of the chest was performed using the standard protocol during bolus administration of intravenous contrast. Multiplanar CT image reconstructions and MIPs were obtained to evaluate the vascular anatomy. CONTRAST:  OMNIPAQUE IOHEXOL 350 MG/ML SOLN COMPARISON:  Chest radiograph 06/22/2019 FINDINGS: Cardiovascular: Satisfactory opacification the pulmonary arteries. Evaluation beyond the lobar level is complicated by extensive respiratory motion artifact. No pulmonary artery filling defects are identified within the central or lobar pulmonary arteries. Central pulmonary arteries are borderline enlarged. There is mild cardiomegaly with predominantly right heart enlargement. Scant coronary calcification is present. Atherosclerotic plaque within the normal caliber aorta. Normal 3 vessel branching of the arch. Minimal calcification in the proximal left subclavian artery. Major venous structures are unremarkable. Mediastinum/Nodes: There is conglomerate nodal tissue in the subcarinal region extending to the right hilum which is low-attenuation a does measure up to 14 mm short axis (4/86). No acute abnormality of the esophagus. Bowing of the posterior trachea likely related to imaging during exhalation. Thyroid gland and thoracic inlet are unremarkable. Lungs/Pleura: Evaluation of the lung parenchyma is complicated by extensive respiratory motion artifact. There is some patchy airspace disease in the right lower lobe with peripheral ground-glass opacity as well as peribronchovascular thickening and opacity extending from the right hilum. Several of the right lower lobe airways terminate in likely mucous  filled bronchi. Additional atelectatic changes noted in the lung bases. No pneumothorax. No visible effusion. Upper Abdomen: A 9 mm hyperdense focus in the dome of the liver (4/99) is too small to fully characterize on CT imaging but statistically likely benign. No acute abnormalities present in the visualized portions of the upper abdomen. Musculoskeletal: Multilevel degenerative changes are present in the imaged portions of the spine. No acute osseous abnormality or suspicious osseous lesion. Review of the MIP images confirms the above findings. IMPRESSION: 1. Study is complicated by extensive respiratory motion artifact. 2. No evidence of pulmonary embolus within the central or lobar pulmonary arteries. 3. Airspace disease in the right lower lobe with peripheral ground-glass opacity as well as peribronchovascular thickening and opacity extending from the right hilum with mucous filled airways. Findings are favored to represent sequela of aspiration versus other acute infectious/inflammatory process, however, follow-up imaging after treatment of the acute symptoms is recommended to document resolution and exclude underlying malignancy. 4. Right hilar and subcarinal adenopathy,  likely reactive. 5. 9 mm hyperdense focus in the dome of the liver is too small to fully characterize on CT imaging but statistically likely benign. Continued attention on follow-up imaging is recommended. 6.  Aortic Atherosclerosis (ICD10-I70.0). Electronically Signed   By: Kreg Shropshire M.D.   On: 06/23/2019 02:38   DG Chest Portable 1 View  Result Date: 06/22/2019 CLINICAL DATA:  Shortness of breath, cough and fever EXAM: PORTABLE CHEST 1 VIEW COMPARISON:  Radiograph 06/30/2017 FINDINGS: Low lung volumes with streaky opacities in the bases favoring atelectatic change. No consolidative opacity, features of edema, pneumothorax or effusion. The aorta is calcified. The remaining cardiomediastinal contours are unremarkable. No acute  osseous or soft tissue abnormality. IMPRESSION: 1. Low lung volumes with streaky opacities in the bases favoring atelectasis. No other acute cardiopulmonary abnormality. 2.  Aortic Atherosclerosis (ICD10-I70.0). Electronically Signed   By: Kreg Shropshire M.D.   On: 06/22/2019 23:19       Assessment & Plan:    Active Problems:   Cough   CAP (community acquired pneumonia)  CAP Blood culture x2  Urine strep antigen Urine legionella antigen  Rocephin 2mg  iv qday, Zithromax 500mg  iv qday Alubterol HFA 2puff q4h prn  R/o Covid -19 PUI for now  Insomnia Lunesta-> Ambien 5mg  po at bedtime prn   DVT Prophylaxis-   Lovenox - SCDs   AM Labs Ordered, also please review Full Orders  Family Communication: Admission, patients condition and plan of care including tests being ordered have been discussed with the patient  who indicate understanding and agree with the plan and Code Status.  Code Status:  FULL CODE per patient,  Spoke with wife, please notify her of her husbands Covid -19 pcr test, Thanks  Admission status: Observation: Based on patients clinical presentation and evaluation of above clinical data, I have made determination that patient meets observation criteria at this time.   Time spent in minutes : 55 minutes,     Pearson Grippe M.D on 06/23/2019 at 3:54 AM

## 2019-06-23 NOTE — ED Notes (Signed)
Provided patient a pillow and Best boy. Assisted patient with charging his phone.

## 2019-06-23 NOTE — Progress Notes (Addendum)
Patient seen and examined this morning, H&P reviewed and agree with assessment and plan.  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  Imaging in the emergency room with CT angiogram showed no PE however it did show airspace disease in the right lower lobe with peripheral GGO concerning for acute infectious/inflammatory process.  He was admitted to the hospital as a PUI  Appreciates his breathing has gotten a little bit better this morning but still significantly short of breath  Constitutional: Uncomfortable, coughing Vitals:   06/23/19 0530 06/23/19 0600 06/23/19 0615 06/23/19 0645  BP: 132/68 126/68 135/67 133/71  Pulse: 95 95 91 93  Resp: 20 (!) 23 (!) 21 (!) 21  Temp:      TempSrc:      SpO2: 99% 99% 99% 98%  Weight:      Height:       Eyes: PERRL, lids and conjunctivae normal ENMT: Mucous membranes are moist. Neck: normal, supple, no masses, no thyromegaly Respiratory: Diminished at the bases, no wheezing, diffuse rhonchi worse with coughing Cardiovascular: Regular rate and rhythm, no murmurs / rubs / gallops. No extremity edema.  Abdomen: no tenderness, no masses palpated. Bowel sounds positive.  Musculoskeletal: no clubbing / cyanosis. Normal muscle tone.  Skin: no rashes Neurologic: Nonfocal  Community-acquired pneumonia with acute hypoxic respiratory failure -Patient was started on ceftriaxone and azithromycin, continue, he tested negative for Covid both his antigen and as PCR -Continue supportive treatment, he was requiring 2 L on admission.on my evaluation this morning he is on room air   Samual Beals M. Cruzita Lederer, MD, PhD [facility]  Between 7 am - 7 pm I am available, please contact me via Amion or Securechat Between 7 pm - 7 am I am not available, please contact night coverage  MD/APP via Amion

## 2019-06-23 NOTE — ED Notes (Signed)
Pt provided food tray

## 2019-06-23 NOTE — ED Notes (Signed)
Son, Fredonia Highland would like an update on his father, [phone].

## 2019-06-24 LAB — COMPREHENSIVE METABOLIC PANEL
ALT: 14 U/L (ref 0–44)
AST: 14 U/L — ABNORMAL LOW (ref 15–41)
Albumin: 3.5 g/dL (ref 3.5–5.0)
Alkaline Phosphatase: 82 U/L (ref 38–126)
Anion gap: 9 (ref 5–15)
BUN: 16 mg/dL (ref 8–23)
CO2: 22 mmol/L (ref 22–32)
Calcium: 9.2 mg/dL (ref 8.9–10.3)
Chloride: 108 mmol/L (ref 98–111)
Creatinine, Ser: 0.81 mg/dL (ref 0.61–1.24)
GFR calc Af Amer: >60 mL/min (ref >60)
GFR calc non Af Amer: >60 mL/min (ref >60)
Glucose, Bld: 86 mg/dL (ref 70–99)
Potassium: 3.8 mmol/L (ref 3.5–5.1)
Sodium: 139 mmol/L (ref 135–145)
Total Bilirubin: 1.3 mg/dL — ABNORMAL HIGH (ref 0.3–1.2)
Total Protein: 6.3 g/dL — ABNORMAL LOW (ref 6.5–8.1)

## 2019-06-24 LAB — CBC
HCT: 43.1 % (ref 39.0–52.0)
Hemoglobin: 14.2 g/dL (ref 13.0–17.0)
MCH: 31.8 pg (ref 26.0–34.0)
MCHC: 32.9 g/dL (ref 30.0–36.0)
MCV: 96.4 fL (ref 80.0–100.0)
Platelets: 123 10*3/uL — ABNORMAL LOW (ref 150–400)
RBC: 4.47 MIL/uL (ref 4.22–5.81)
RDW: 12.9 % (ref 11.5–15.5)
WBC: 14 10*3/uL — ABNORMAL HIGH (ref 4.0–10.5)
nRBC: 0 % (ref 0.0–0.2)

## 2019-06-24 LAB — URINE CULTURE: Culture: NO GROWTH

## 2019-06-24 LAB — LEGIONELLA PNEUMOPHILA SEROGP 1 UR AG: L. pneumophila Serogp 1 Ur Ag: NEGATIVE

## 2019-06-24 NOTE — Progress Notes (Signed)
MD placed order for cardiac monitoring for 24 hours, paged MD and requested a transfer order to a telemetry floor.  Md stated that he would cancel order, so that he can stay on this floor

## 2019-06-24 NOTE — Progress Notes (Addendum)
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  Imaging in the emergency room with CT angiogram showed no PE however it did show airspace disease in the right lower lobe with peripheral GGO concerning for acute infectious/inflammatory process.  He was admitted to the hospital as a PUI and then later changed to Covid negative as he was tested negative.  Subjective: Patient states he is a still having some shortness of breath and dyspnea on exertion but is not requiring any supplemental oxygen at this time.  Patient appeared very tired.   Objective: Vital signs in last 24 hours: Temp:  [98 F (36.7 C)-99.6 F (37.6 C)] 98 F (36.7 C) (12/23 1405) Pulse Rate:  [73-98] 98 (12/23 1405) Resp:  [15-24] 16 (12/23 1405) BP: (126-151)/(60-71) 136/71 (12/23 1405) SpO2:  [94 %-98 %] 96 % (12/23 1405)  Intake/Output from previous day: 12/22 0701 - 12/23 0700 In: 1630 [P.O.:180; I.V.:1000] Out: 300 [Urine:300] Intake/Output this shift: Total I/O In: 120 [P.O.:120] Out: 200 [Urine:200]  Eyes: PERRL, lids and conjunctivae normal ENMT: Mucous membranes are moist. Neck: normal, supple, no masses, no thyromegaly Respiratory: Diminished at the bases, no wheezing, diffuse rhonchi worse with coughing Cardiovascular: Regular rate and rhythm, no murmurs / rubs / gallops. No extremity edema.  Abdomen: no tenderness, no masses palpated. Bowel sounds positive.  Musculoskeletal: no clubbing / cyanosis. Normal muscle tone.  Skin: no rashes Neurologic: Nonfocal  Results for orders placed or performed during the hospital encounter of 06/22/19 (from the past 24 hour(s))  Comprehensive metabolic panel tomorrow     Status: Abnormal   Collection Time: 06/24/19  4:20 AM  Result Value Ref Range   Sodium 139 135 - 145 mmol/L   Potassium 3.8  3.5 - 5.1 mmol/L   Chloride 108 98 - 111 mmol/L   CO2 22 22 - 32 mmol/L   Glucose, Bld 86 70 - 99 mg/dL   BUN 16 8 - 23 mg/dL   Creatinine, Ser 8.84 0.61 - 1.24 mg/dL   Calcium 9.2 8.9 - 16.6 mg/dL   Total Protein 6.3 (L) 6.5 - 8.1 g/dL   Albumin 3.5 3.5 - 5.0 g/dL   AST 14 (L) 15 - 41 U/L   ALT 14 0 - 44 U/L   Alkaline Phosphatase 82 38 - 126 U/L   Total Bilirubin 1.3 (H) 0.3 - 1.2 mg/dL   GFR calc non Af Amer >60 >60 mL/min   GFR calc Af Amer >60 >60 mL/min   Anion gap 9 5 - 15  CBC Tomorrow     Status: Abnormal   Collection Time: 06/24/19  4:20 AM  Result Value Ref Range   WBC 14.0 (H) 4.0 - 10.5 K/uL   RBC 4.47 4.22 - 5.81 MIL/uL   Hemoglobin 14.2 13.0 - 17.0 g/dL   HCT 06.3 01.6 - 01.0 %   MCV 96.4 80.0 - 100.0 fL   MCH 31.8 26.0 - 34.0 pg   MCHC 32.9 30.0 - 36.0 g/dL   RDW 93.2 35.5 - 73.2 %   Platelets 123 (L) 150 - 400 K/uL   nRBC 0.0 0.0 - 0.2 %    Studies/Results: CT Angio Chest PE W and/or Wo Contrast  Result Date: 06/23/2019 CLINICAL DATA:  Shortness of breath, fever, body aches, leukocytosis, negative point of care COVID-19 testing EXAM: CT ANGIOGRAPHY CHEST WITH CONTRAST  TECHNIQUE: Multidetector CT imaging of the chest was performed using the standard protocol during bolus administration of intravenous contrast. Multiplanar CT image reconstructions and MIPs were obtained to evaluate the vascular anatomy. CONTRAST:  117mL OMNIPAQUE IOHEXOL 350 MG/ML SOLN COMPARISON:  Chest radiograph 06/22/2019 FINDINGS: Cardiovascular: Satisfactory opacification the pulmonary arteries. Evaluation beyond the lobar level is complicated by extensive respiratory motion artifact. No pulmonary artery filling defects are identified within the central or lobar pulmonary arteries. Central pulmonary arteries are borderline enlarged. There is mild cardiomegaly with predominantly right heart enlargement. Scant coronary calcification is present. Atherosclerotic plaque within the normal caliber  aorta. Normal 3 vessel branching of the arch. Minimal calcification in the proximal left subclavian artery. Major venous structures are unremarkable. Mediastinum/Nodes: There is conglomerate nodal tissue in the subcarinal region extending to the right hilum which is low-attenuation a does measure up to 14 mm short axis (4/86). No acute abnormality of the esophagus. Bowing of the posterior trachea likely related to imaging during exhalation. Thyroid gland and thoracic inlet are unremarkable. Lungs/Pleura: Evaluation of the lung parenchyma is complicated by extensive respiratory motion artifact. There is some patchy airspace disease in the right lower lobe with peripheral ground-glass opacity as well as peribronchovascular thickening and opacity extending from the right hilum. Several of the right lower lobe airways terminate in likely mucous filled bronchi. Additional atelectatic changes noted in the lung bases. No pneumothorax. No visible effusion. Upper Abdomen: A 9 mm hyperdense focus in the dome of the liver (4/99) is too small to fully characterize on CT imaging but statistically likely benign. No acute abnormalities present in the visualized portions of the upper abdomen. Musculoskeletal: Multilevel degenerative changes are present in the imaged portions of the spine. No acute osseous abnormality or suspicious osseous lesion. Review of the MIP images confirms the above findings. IMPRESSION: 1. Study is complicated by extensive respiratory motion artifact. 2. No evidence of pulmonary embolus within the central or lobar pulmonary arteries. 3. Airspace disease in the right lower lobe with peripheral ground-glass opacity as well as peribronchovascular thickening and opacity extending from the right hilum with mucous filled airways. Findings are favored to represent sequela of aspiration versus other acute infectious/inflammatory process, however, follow-up imaging after treatment of the acute symptoms is  recommended to document resolution and exclude underlying malignancy. 4. Right hilar and subcarinal adenopathy, likely reactive. 5. 9 mm hyperdense focus in the dome of the liver is too small to fully characterize on CT imaging but statistically likely benign. Continued attention on follow-up imaging is recommended. 6.  Aortic Atherosclerosis (ICD10-I70.0). Electronically Signed   By: Lovena Le M.D.   On: 06/23/2019 02:38   DG Chest Portable 1 View  Result Date: 06/22/2019 CLINICAL DATA:  Shortness of breath, cough and fever EXAM: PORTABLE CHEST 1 VIEW COMPARISON:  Radiograph 06/30/2017 FINDINGS: Low lung volumes with streaky opacities in the bases favoring atelectatic change. No consolidative opacity, features of edema, pneumothorax or effusion. The aorta is calcified. The remaining cardiomediastinal contours are unremarkable. No acute osseous or soft tissue abnormality. IMPRESSION: 1. Low lung volumes with streaky opacities in the bases favoring atelectasis. No other acute cardiopulmonary abnormality. 2.  Aortic Atherosclerosis (ICD10-I70.0). Electronically Signed   By: Lovena Le M.D.   On: 06/22/2019 23:19    Scheduled Meds: . enoxaparin (LOVENOX) injection  40 mg Subcutaneous Daily   Continuous Infusions: . azithromycin 500 mg (06/24/19 0925)  . cefTRIAXone (ROCEPHIN)  IV 2 g (06/24/19 0517)   PRN Meds:albuterol, zolpidem  Assessment/Plan:  Community acquired pneumonia: Still having some shortness of breath and dyspnea Blood culture x2 so far negative Urine strep antigen resulted positive Urine legionella antigen pending Continue Rocephin 2mg  iv qday, Zithromax 500mg  iv qday Alubterol HFA 2puff q4h prn -Continue to monitor WBC; trending down from admission -RT/nursing assessment ambulate patient to monitor O2 sat and check for any supplemental oxygen need  - continuous pulse oximetry  R/o Covid -19: Tested negative for both COVID-19 antigen and PCR  Insomnia:  Fluctuating Lunesta-> Ambien 5mg  po at bedtime prn   DVT Prophylaxis-   Lovenox - SCDs    LOS: 1 day   [employer]

## 2019-06-25 LAB — CBC WITH DIFFERENTIAL/PLATELET
Abs Immature Granulocytes: 0.06 10*3/uL (ref 0.00–0.07)
Basophils Absolute: 0 10*3/uL (ref 0.0–0.1)
Basophils Relative: 0 %
Eosinophils Absolute: 0 10*3/uL (ref 0.0–0.5)
Eosinophils Relative: 0 %
HCT: 46.1 % (ref 39.0–52.0)
Hemoglobin: 15.3 g/dL (ref 13.0–17.0)
Immature Granulocytes: 1 %
Lymphocytes Relative: 8 %
Lymphs Abs: 1 10*3/uL (ref 0.7–4.0)
MCH: 31.4 pg (ref 26.0–34.0)
MCHC: 33.2 g/dL (ref 30.0–36.0)
MCV: 94.5 fL (ref 80.0–100.0)
Monocytes Absolute: 0.7 10*3/uL (ref 0.1–1.0)
Monocytes Relative: 6 %
Neutro Abs: 9.9 10*3/uL — ABNORMAL HIGH (ref 1.7–7.7)
Neutrophils Relative %: 85 %
Platelets: 143 10*3/uL — ABNORMAL LOW (ref 150–400)
RBC: 4.88 MIL/uL (ref 4.22–5.81)
RDW: 12.6 % (ref 11.5–15.5)
WBC: 11.6 10*3/uL — ABNORMAL HIGH (ref 4.0–10.5)
nRBC: 0 % (ref 0.0–0.2)

## 2019-06-25 MED ORDER — PSYLLIUM 95 % PO PACK: 1.0000 | PACK | Freq: Every day | ORAL | 2 refills | Status: AC

## 2019-06-25 MED ORDER — CEFUROXIME AXETIL 500 MG PO TABS: 500.0000 mg | ORAL_TABLET | Freq: Two times a day (BID) | ORAL | 0 refills | Status: AC

## 2019-06-25 MED ORDER — CEFUROXIME AXETIL 500 MG PO TABS: 500.0000 mg | ORAL_TABLET | Freq: Two times a day (BID) | ORAL | Status: DC

## 2019-06-25 MED ORDER — POLYETHYLENE GLYCOL 3350 17 G PO PACK
17.0000 g | PACK | Freq: Every day | ORAL | Status: DC
  Administered 2019-06-25: 17 g via ORAL
  Filled 2019-06-25: qty 1

## 2019-06-25 MED ORDER — PSYLLIUM 95 % PO PACK
1.0000 | PACK | Freq: Every day | ORAL | Status: DC
  Administered 2019-06-25: 1 via ORAL
  Filled 2019-06-25: qty 1

## 2019-06-25 NOTE — Progress Notes (Signed)
Ambulated Patient on the floor. Tolerated well, No SOB.  Patient Saturations on Room Air at Rest = 97%  Patient Saturations on [employer] while Ambulating = 94%

## 2019-06-25 NOTE — Discharge Instructions (Signed)
Cough, Adult Coughing is a reflex that clears your throat and your airways (respiratory system). Coughing helps to heal and protect your lungs. It is normal to cough occasionally, but a cough that happens with other symptoms or lasts a long time may be a sign of a condition that needs treatment. An acute cough may only last 2-3 weeks, while a chronic cough may last 8 or more weeks. Coughing is commonly caused by:  Infection of the respiratory systemby viruses or bacteria.  Breathing in substances that irritate your lungs.  Allergies.  Asthma.  Mucus that runs down the back of your throat (postnasal drip).  Smoking.  Acid backing up from the stomach into the esophagus (gastroesophageal reflux).  Certain medicines.  Chronic lung problems.  Other medical conditions such as heart failure or a blood clot in the lung (pulmonary embolism). Follow these instructions at home: Medicines  Take over-the-counter and prescription medicines only as told by your health care provider.  Talk with your health care provider before you take a cough suppressant medicine. Lifestyle   Avoid cigarette smoke. Do not use any products that contain nicotine or tobacco, such as cigarettes, e-cigarettes, and chewing tobacco. If you need help quitting, ask your health care provider.  Drink enough fluid to keep your urine pale yellow.  Avoid caffeine.  Do not drink alcohol if your health care provider tells you not to drink. General instructions   Pay close attention to changes in your cough. Tell your health care provider about them.  Always cover your mouth when you cough.  Avoid things that make you cough, such as perfume, candles, cleaning products, or campfire or tobacco smoke.  If the air is dry, use a cool mist vaporizer or humidifier in your bedroom or your home to help loosen secretions.  If your cough is worse at night, try to sleep in a semi-upright position.  Rest as  needed.  Keep all follow-up visits as told by your health care provider. This is important. Contact a health care provider if you:  Have new symptoms.  Cough up pus.  Have a cough that does not get better after 2-3 weeks or gets worse.  Cannot control your cough with cough suppressant medicines and you are losing sleep.  Have pain that gets worse or pain that is not helped with medicine.  Have a fever.  Have unexplained weight loss.  Have night sweats. Get help right away if:  You cough up blood.  You have difficulty breathing.  Your heartbeat is very fast. These symptoms may represent a serious problem that is an emergency. Do not wait to see if the symptoms will go away. Get medical help right away. Call your local emergency services (911 in the [place] Do not drive yourself to the hospital. Summary  Coughing is a reflex that clears your throat and your airways. It is normal to cough occasionally, but a cough that happens with other symptoms or lasts a long time may be a sign of a condition that needs treatment.  Take over-the-counter and prescription medicines only as told by your health care provider.  Always cover your mouth when you cough.  Contact a health care provider if you have new symptoms or a cough that does not get better after 2-3 weeks or gets worse. This information is not intended to replace advice given to you by your health care provider. Make sure you discuss any questions you have with your health care provider. Document Released:  12/15/2010 Document Revised: 07/07/2018 Document Reviewed: 07/07/2018 Elsevier Patient Education  2020 [employer] Community-Acquired Pneumonia, Adult Pneumonia is an infection of the lungs. It causes swelling in the airways of the lungs. Mucus and fluid may also build up inside the airways. One type of pneumonia can happen while a person is in a hospital. A different type can happen when a person is not in a hospital  (community-acquired pneumonia).  What are the causes?  This condition is caused by germs (viruses, bacteria, or fungi). Some types of germs can be passed from one person to another. This can happen when you breathe in droplets from the cough or sneeze of an infected person. What increases the risk? You are more likely to develop this condition if you:  Have a long-term (chronic) disease, such as: ? Chronic obstructive pulmonary disease (COPD). ? Asthma. ? Cystic fibrosis. ? Congestive heart failure. ? Diabetes. ? Kidney disease.  Have HIV.  Have sickle cell disease.  Have had your spleen removed.  Do not take good care of your teeth and mouth (poor dental hygiene).  Have a medical condition that increases the risk of breathing in droplets from your own mouth and nose.  Have a weakened body defense system (immune system).  Are a smoker.  Travel to areas where the germs that cause this illness are common.  Are around certain animals or the places they live. What are the signs or symptoms?  A dry cough.  A wet (productive) cough.  Fever.  Sweating.  Chest pain. This often happens when breathing deeply or coughing.  Fast breathing or trouble breathing.  Shortness of breath.  Shaking chills.  Feeling tired (fatigue).  Muscle aches. How is this treated? Treatment for this condition depends on many things. Most adults can be treated at home. In some cases, treatment must happen in a hospital. Treatment may include:  Medicines given by mouth or through an IV tube.  Being given extra oxygen.  Respiratory therapy. In rare cases, treatment for very bad pneumonia may include:  Using a machine to help you breathe.  Having a procedure to remove fluid from around your lungs. Follow these instructions at home: Medicines  Take over-the-counter and prescription medicines only as told by your doctor. ? Only take cough medicine if you are losing sleep.  If you were  prescribed an antibiotic medicine, take it as told by your doctor. Do not stop taking the antibiotic even if you start to feel better. General instructions   Sleep with your head and neck raised (elevated). You can do this by sleeping in a recliner or by putting a few pillows under your head.  Rest as needed. Get at least 8 hours of sleep each night.  Drink enough water to keep your pee (urine) pale yellow.  Eat a healthy diet that includes plenty of vegetables, fruits, whole grains, low-fat dairy products, and lean protein.  Do not use any products that contain nicotine or tobacco. These include cigarettes, e-cigarettes, and chewing tobacco. If you need help quitting, ask your doctor.  Keep all follow-up visits as told by your doctor. This is important. How is this prevented? A shot (vaccine) can help prevent pneumonia. Shots are often suggested for:  People older than 72 years of age.  People older than 72 years of age who: ? Are having cancer treatment. ? Have long-term (chronic) lung disease. ? Have problems with their body's defense system. You may also prevent pneumonia if you take these  actions:  Get the flu (influenza) shot every year.  Go to the dentist as often as told.  Wash your hands often. If you cannot use soap and water, use hand sanitizer. Contact a doctor if:  You have a fever.  You lose sleep because your cough medicine does not help. Get help right away if:  You are short of breath and it gets worse.  You have more chest pain.  Your sickness gets worse. This is very serious if: ? You are an older adult. ? Your body's defense system is weak.  You cough up blood. Summary  Pneumonia is an infection of the lungs.  Most adults can be treated at home. Some will need treatment in a hospital.  Drink enough water to keep your pee pale yellow.  Get at least 8 hours of sleep each night. This information is not intended to replace advice given to you by  your health care provider. Make sure you discuss any questions you have with your health care provider. Document Released: 12/05/2007 Document Revised: 10/08/2018 Document Reviewed: 02/13/2018 Elsevier Patient Education  2020 [employer]

## 2019-06-25 NOTE — Progress Notes (Signed)
All discharge instructions were given and pt verbalized understanding.

## 2019-06-26 NOTE — Discharge Summary (Signed)
Physician Discharge Summary  Patient ID: George Paul. MRN: [id number] DOB/AGE: [birth year] 71 y.o.  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 Imaging in the emergency room with CT angiogram showed no PE however it did show airspace disease in the right lower lobe with peripheral GGO concerning for acute infectious/inflammatory process.He was admitted to the hospital as a PUI and then later changed to Covid negative as he was tested negative.  Community acquired pneumonia: Dyspnea and SOB resolved  Blood culture x2 so far negative Urine strep antigen resulted positive Urine legionella antigen pending Was Rocephin 2mg  iv qday, Zithromax 500mg  iv qday; discharged home with Cefuroxime for 2 more days and received 1 dose before discharge - Bronchodilator  -RT/nursing assessment and Pt maintained O2 sat above 94% at rest and ambulation on room air.    R/o Covid -19: Tested negative for both COVID-19 antigen and PCR  Consults: None  Significant Diagnostic Studies: CT-Angio/chest, blood work   Treatments: Per hospital course and D/C medlist   Discharge Exam: Blood pressure (!) 158/82, pulse 92, temperature 98.4 F (36.9 C), temperature source Oral, resp. rate 18, height 5\' 11"  (1.803 m), weight 86.2 kg, SpO2 94 %.  Eyes:PERRL, lids and conjunctivae normal ENMT:Mucous membranes are moist. Neck:normal, supple, no masses, no thyromegaly Respiratory:Clear mostly to ausc B/L Cardiovascular:Regular rate and rhythm, no murmurs / rubs / gallops. No extremity edema.  Abdomen:no  tenderness, no masses palpated. Bowel sounds positive.  Musculoskeletal:no clubbing / cyanosis. Normal muscle tone.  Skin:no rashes Neurologic:Nonfocal  Disposition: Discharge disposition: 01-Home or Self Care     Stable to be D/C home with f/u with PCP in 1-2 weeks.   Discharge Instructions    Call MD for:  difficulty breathing, headache or visual disturbances   Complete by: As directed    Diet - low sodium heart healthy   Complete by: As directed    Increase activity slowly   Complete by: As directed      Allergies as of 06/25/2019   No Known Allergies     Medication List    TAKE these medications   albuterol 108 (90 Base) MCG/ACT inhaler Commonly known as: VENTOLIN HFA Inhale 2 puffs into the lungs every 4 (four) hours as needed for wheezing or shortness of breath.   cefUROXime 500 MG tablet Commonly known as: CEFTIN Take 1 tablet (500 mg total) by mouth 2 (two) times daily with a meal for 2 days.   chlorpheniramine-HYDROcodone 10-8 MG/5ML Lqcr Commonly known as: Tussionex Pennkinetic ER Take 5 mLs by mouth every 12 (twelve) hours as needed for cough.   E-Z Spacer inhaler Use as instructed   eszopiclone 2 MG Tabs tablet Commonly known as: LUNESTA Take 3 mg by mouth at bedtime. Take immediately before bedtime   mirtazapine 15 MG tablet Commonly known as: REMERON Take 7.5 mg by mouth at bedtime.   oseltamivir 75 MG capsule Commonly known as: Tamiflu Take 1 capsule (75 mg total) by mouth 2 (two) times daily.   psyllium 95 % Pack Commonly known as: HYDROCIL/METAMUCIL Take 1 packet by mouth daily.  Signed: Thomasenia Bottoms 06/26/2019, 6:02 PM

## 2019-06-28 LAB — CULTURE, BLOOD (ROUTINE X 2)
Culture: NO GROWTH
Culture: NO GROWTH
Special Requests: ADEQUATE

## 2019-08-11 ENCOUNTER — Other Ambulatory Visit: Payer: Self-pay | Admitting: Family Medicine

## 2019-08-11 ENCOUNTER — Ambulatory Visit
Admission: RE | Admit: 2019-08-11 | Discharge: 2019-08-11 | Disposition: A | Discharge status: Home / Self Care | Payer: Federal, State, Local not specified - PPO | Source: Ambulatory Visit | Attending: Family Medicine | Admitting: Family Medicine

## 2019-08-11 DIAGNOSIS — J189 Pneumonia, unspecified organism: Secondary | ICD-10-CM

## 2020-05-30 ENCOUNTER — Encounter (INDEPENDENT_AMBULATORY_CARE_PROVIDER_SITE_OTHER): Payer: Federal, State, Local not specified - PPO | Admitting: Ophthalmology

## 2020-05-31 ENCOUNTER — Encounter (INDEPENDENT_AMBULATORY_CARE_PROVIDER_SITE_OTHER): Payer: Medicare Other | Admitting: Ophthalmology

## 2020-05-31 ENCOUNTER — Other Ambulatory Visit: Payer: Self-pay

## 2020-05-31 DIAGNOSIS — H43813 Vitreous degeneration, bilateral: Secondary | ICD-10-CM

## 2020-05-31 DIAGNOSIS — H353131 Nonexudative age-related macular degeneration, bilateral, early dry stage: Secondary | ICD-10-CM

## 2020-10-18 ENCOUNTER — Ambulatory Visit (INDEPENDENT_AMBULATORY_CARE_PROVIDER_SITE_OTHER): Payer: Federal, State, Local not specified - PPO

## 2020-10-18 ENCOUNTER — Encounter (HOSPITAL_COMMUNITY): Payer: Self-pay

## 2020-10-18 ENCOUNTER — Other Ambulatory Visit: Payer: Self-pay

## 2020-10-18 ENCOUNTER — Ambulatory Visit (HOSPITAL_COMMUNITY)
Admission: EM | Admit: 2020-10-18 | Discharge: 2020-10-18 | Disposition: A | Discharge status: Home / Self Care | Payer: Federal, State, Local not specified - PPO | Attending: Physician Assistant | Admitting: Physician Assistant

## 2020-10-18 DIAGNOSIS — Z8701 Personal history of pneumonia (recurrent): Secondary | ICD-10-CM | POA: Insufficient documentation

## 2020-10-18 DIAGNOSIS — Z87891 Personal history of nicotine dependence: Secondary | ICD-10-CM | POA: Insufficient documentation

## 2020-10-18 DIAGNOSIS — Z79899 Other long term (current) drug therapy: Secondary | ICD-10-CM | POA: Insufficient documentation

## 2020-10-18 DIAGNOSIS — Z20822 Contact with and (suspected) exposure to covid-19: Secondary | ICD-10-CM | POA: Diagnosis not present

## 2020-10-18 DIAGNOSIS — R0989 Other specified symptoms and signs involving the circulatory and respiratory systems: Secondary | ICD-10-CM | POA: Diagnosis not present

## 2020-10-18 DIAGNOSIS — R059 Cough, unspecified: Secondary | ICD-10-CM

## 2020-10-18 DIAGNOSIS — R051 Acute cough: Secondary | ICD-10-CM | POA: Diagnosis present

## 2020-10-18 DIAGNOSIS — R058 Other specified cough: Secondary | ICD-10-CM | POA: Insufficient documentation

## 2020-10-18 DIAGNOSIS — J069 Acute upper respiratory infection, unspecified: Secondary | ICD-10-CM | POA: Insufficient documentation

## 2020-10-18 MED ORDER — BENZONATATE 100 MG PO CAPS: 100.0000 mg | ORAL_CAPSULE | Freq: Three times a day (TID) | ORAL | 0 refills | Status: DC

## 2020-10-18 NOTE — Discharge Instructions (Signed)
Your chest x-ray was normal.  We will be in touch with your COVID-19 results as soon as we have them.  I have prescribed Tessalon to help with cough.  You can continue with Mucinex DM for cough and Flonase for nasal congestion.  If you have any worsening symptoms you need to be reevaluated.  Please follow-up with your PCP.

## 2020-10-18 NOTE — ED Triage Notes (Signed)
Pt present cough with nasal congestion. Symptoms started on Sunday. Pt would like a chest x-ray

## 2020-10-18 NOTE — ED Provider Notes (Signed)
[facility]    CSN: [id number] Arrival date & time: 10/18/20  1755      History   Chief Complaint Chief Complaint  Patient presents with  . Cough  . Nasal Congestion    HPI George Paul. is a 75 y.o. male.   Patient presents today with a 5-day history of worsening productive cough.  He reports associated nasal congestion, subjective fever, fatigue.  Denies any chest pain, shortness of breath, nausea, vomiting, dizziness, headache.  He did take an at home COVID test which was negative.  He is up-to-date on COVID-19 vaccines including booster.  He is up-to-date on flu shot.  He denies any known sick contacts.  Denies any recent antibiotic use.  He does have a history of community-acquired pneumonia and is requesting chest x-ray as previous episode began with similar symptoms.  He denies any history of allergies, asthma, COPD.  He is a former smoker but quit more than 45 years ago.     Past Medical History:  Diagnosis Date  . Cataract   . Inguinal hernia    bilateral  . Umbilical hernia     Patient Active Problem List   Diagnosis Date Noted  . Cough 06/23/2019  . CAP (community acquired pneumonia) 06/23/2019  . Pneumonia 06/23/2019    Past Surgical History:  Procedure Laterality Date  . arm surgery  2002  . CATARACT EXTRACTION, BILATERAL  2012  . EYE SURGERY  1964  . INGUINAL HERNIA REPAIR  04/15/09   left indirect  . INGUINAL HERNIA REPAIR  01/18/11   right indirect  . TONSILLECTOMY  1959  . UMBILICAL HERNIA REPAIR  1998       Home Medications    Prior to Admission medications   Medication Sig Start Date End Date Taking? Authorizing Provider  albuterol (PROVENTIL HFA;VENTOLIN HFA) 108 (90 BASE) MCG/ACT inhaler Inhale 2 puffs into the lungs every 4 (four) hours as needed for wheezing or shortness of breath. 10/10/13   Dunn, Raymon Mutton, PA-C  chlorpheniramine-HYDROcodone (TUSSIONEX PENNKINETIC ER) 10-8 MG/5ML LQCR Take 5 mLs by mouth every 12  (twelve) hours as needed for cough. Patient not taking: Reported on 06/23/2019 10/10/13   Sondra Barges, PA-C  eszopiclone (LUNESTA) 2 MG TABS Take 3 mg by mouth at bedtime. Take immediately before bedtime    [provider]  mirtazapine (REMERON) 15 MG tablet Take 7.5 mg by mouth at bedtime. 04/06/19   [provider]  oseltamivir (TAMIFLU) 75 MG capsule Take 1 capsule (75 mg total) by mouth 2 (two) times daily. Patient not taking: Reported on 06/23/2019 10/10/13   Sondra Barges, PA-C  psyllium (HYDROCIL/METAMUCIL) 95 % PACK Take 1 packet by mouth daily. 06/26/19   Thomasenia Bottoms, MD  Spacer/Aero-Holding Chambers (E-Z SPACER) inhaler Use as instructed 10/10/13   Sondra Barges, PA-C    Family History Family History  Problem Relation Age of Onset  . Heart disease Mother     Social History Social History   Tobacco Use  . Smoking status: Former Games developer  . Smokeless tobacco: Never Used  Substance Use Topics  . Alcohol use: Yes    Alcohol/week: 3.0 - 4.0 standard drinks    Types: 3 - 4 Glasses of wine per week    Comment: socially  . Drug use: No     Allergies   Patient has no known allergies.   Review of Systems Review of Systems  Constitutional: Positive for fatigue and fever. Negative for  activity change and appetite change.  HENT: Positive for congestion and sinus pressure. Negative for sneezing and sore throat.   Respiratory: Positive for cough. Negative for shortness of breath.   Cardiovascular: Negative for chest pain.  Gastrointestinal: Negative for abdominal pain, diarrhea, nausea and vomiting.  Musculoskeletal: Negative for arthralgias and myalgias.  Neurological: Negative for dizziness, light-headedness and headaches.     Physical Exam Triage Vital Signs ED Triage Vitals  Enc Vitals Group     BP 10/18/20 1854 (!) 125/101     Pulse Rate 10/18/20 1854 85     Resp 10/18/20 1854 18     Temp 10/18/20 1854 98.3 F (36.8 C)     Temp Source 10/18/20  1854 Oral     SpO2 10/18/20 1854 98 %     Weight --      Height --      Head Circumference --      Peak Flow --      Pain Score 10/18/20 1855 0     Pain Loc --      Pain Edu? --      Excl. in GC? --    No data found.  Updated Vital Signs BP (!) 125/101 (BP Location: Right Arm)   Pulse 85   Temp 98.3 F (36.8 C) (Oral)   Resp 18   SpO2 98%   Visual Acuity Right Eye Distance:   Left Eye Distance:   Bilateral Distance:    Right Eye Near:   Left Eye Near:    Bilateral Near:     Physical Exam Vitals reviewed.  Constitutional:      General: He is awake.     Appearance: Normal appearance. He is normal weight. He is not ill-appearing.     Comments: Very pleasant male appears stated age in no acute distress  HENT:     Head: Normocephalic and atraumatic.     Right Ear: Tympanic membrane, ear canal and external ear normal. Tympanic membrane is not erythematous or bulging.     Left Ear: Tympanic membrane, ear canal and external ear normal. Tympanic membrane is not erythematous or bulging.     Nose:     Right Sinus: Maxillary sinus tenderness present. No frontal sinus tenderness.     Left Sinus: Maxillary sinus tenderness present. No frontal sinus tenderness.     Mouth/Throat:     Pharynx: Uvula midline. No oropharyngeal exudate, posterior oropharyngeal erythema or uvula swelling.     Comments: Drainage present posterior pharynx Cardiovascular:     Rate and Rhythm: Normal rate and regular rhythm.     Heart sounds: No murmur heard.   Pulmonary:     Effort: Pulmonary effort is normal. No accessory muscle usage or respiratory distress.     Breath sounds: Normal breath sounds. No stridor. No wheezing, rhonchi or rales.     Comments: Clear to auscultation bilaterally. Abdominal:     General: Bowel sounds are normal.     Palpations: Abdomen is soft.     Tenderness: There is no abdominal tenderness.  Lymphadenopathy:     Head:     Right side of head: No submental,  submandibular or tonsillar adenopathy.     Left side of head: No submental, submandibular or tonsillar adenopathy.     Cervical: No cervical adenopathy.  Neurological:     Mental Status: He is alert.  Psychiatric:        Behavior: Behavior is cooperative.      UC Treatments /  Results  Labs (all labs ordered are listed, but only abnormal results are displayed) Labs Reviewed  SARS CORONAVIRUS 2 (TAT 6-24 HRS)    EKG   Radiology DG Chest 2 View  Result Date: 10/18/2020 CLINICAL DATA:  Cough and congestion. EXAM: CHEST - 2 VIEW COMPARISON:  08/11/2019 FINDINGS: The lungs are clear without focal pneumonia, edema, pneumothorax or pleural effusion. Subsegmental atelectasis or linear scarring noted left base. The cardiopericardial silhouette is within normal limits for size. The visualized bony structures of the thorax show no acute abnormality. IMPRESSION: No active cardiopulmonary disease. Electronically Signed   By: Kennith Center M.D.   On: 10/18/2020 19:49    Procedures Procedures (including critical care time)  Medications Ordered in UC Medications - No data to display  Initial Impression / Assessment and Plan / UC Course  I have reviewed the triage vital signs and the nursing notes.  Pertinent labs & imaging results that were available during my care of the patient were reviewed by me and considered in my medical decision making (see chart for details).     Chest x-ray showed no acute findings.  Patient was tested for COVID-19-results pending.  Discussed symptoms are likely viral in etiology and encouraged him to rest and drink plenty of fluid until symptoms resolve.  Will prescribe Tessalon for cough treatment.  Discussed that there is no indication to start antibiotics based on evaluation today but encouraged him to follow-up with PCP for reevaluation in the near future.  Strict return precautions given to which patient expressed understanding.  Final Clinical Impressions(s)  / UC Diagnoses   Final diagnoses:  None   Discharge Instructions   None    ED Prescriptions    None     PDMP not reviewed this encounter.   Jeani Hawking, PA-C 10/18/20 2009

## 2020-10-19 LAB — SARS CORONAVIRUS 2 (TAT 6-24 HRS): SARS Coronavirus 2: NEGATIVE

## 2020-11-23 IMAGING — CR DG CHEST 2V
2 series · 2 of 2 positions shown · non-contrast
Comparison: 06/22/2019 and chest CTA dated 06/23/2019

CLINICAL DATA: Follow-up pneumonia

EXAM:
CHEST - 2 VIEW

[w chest pa]
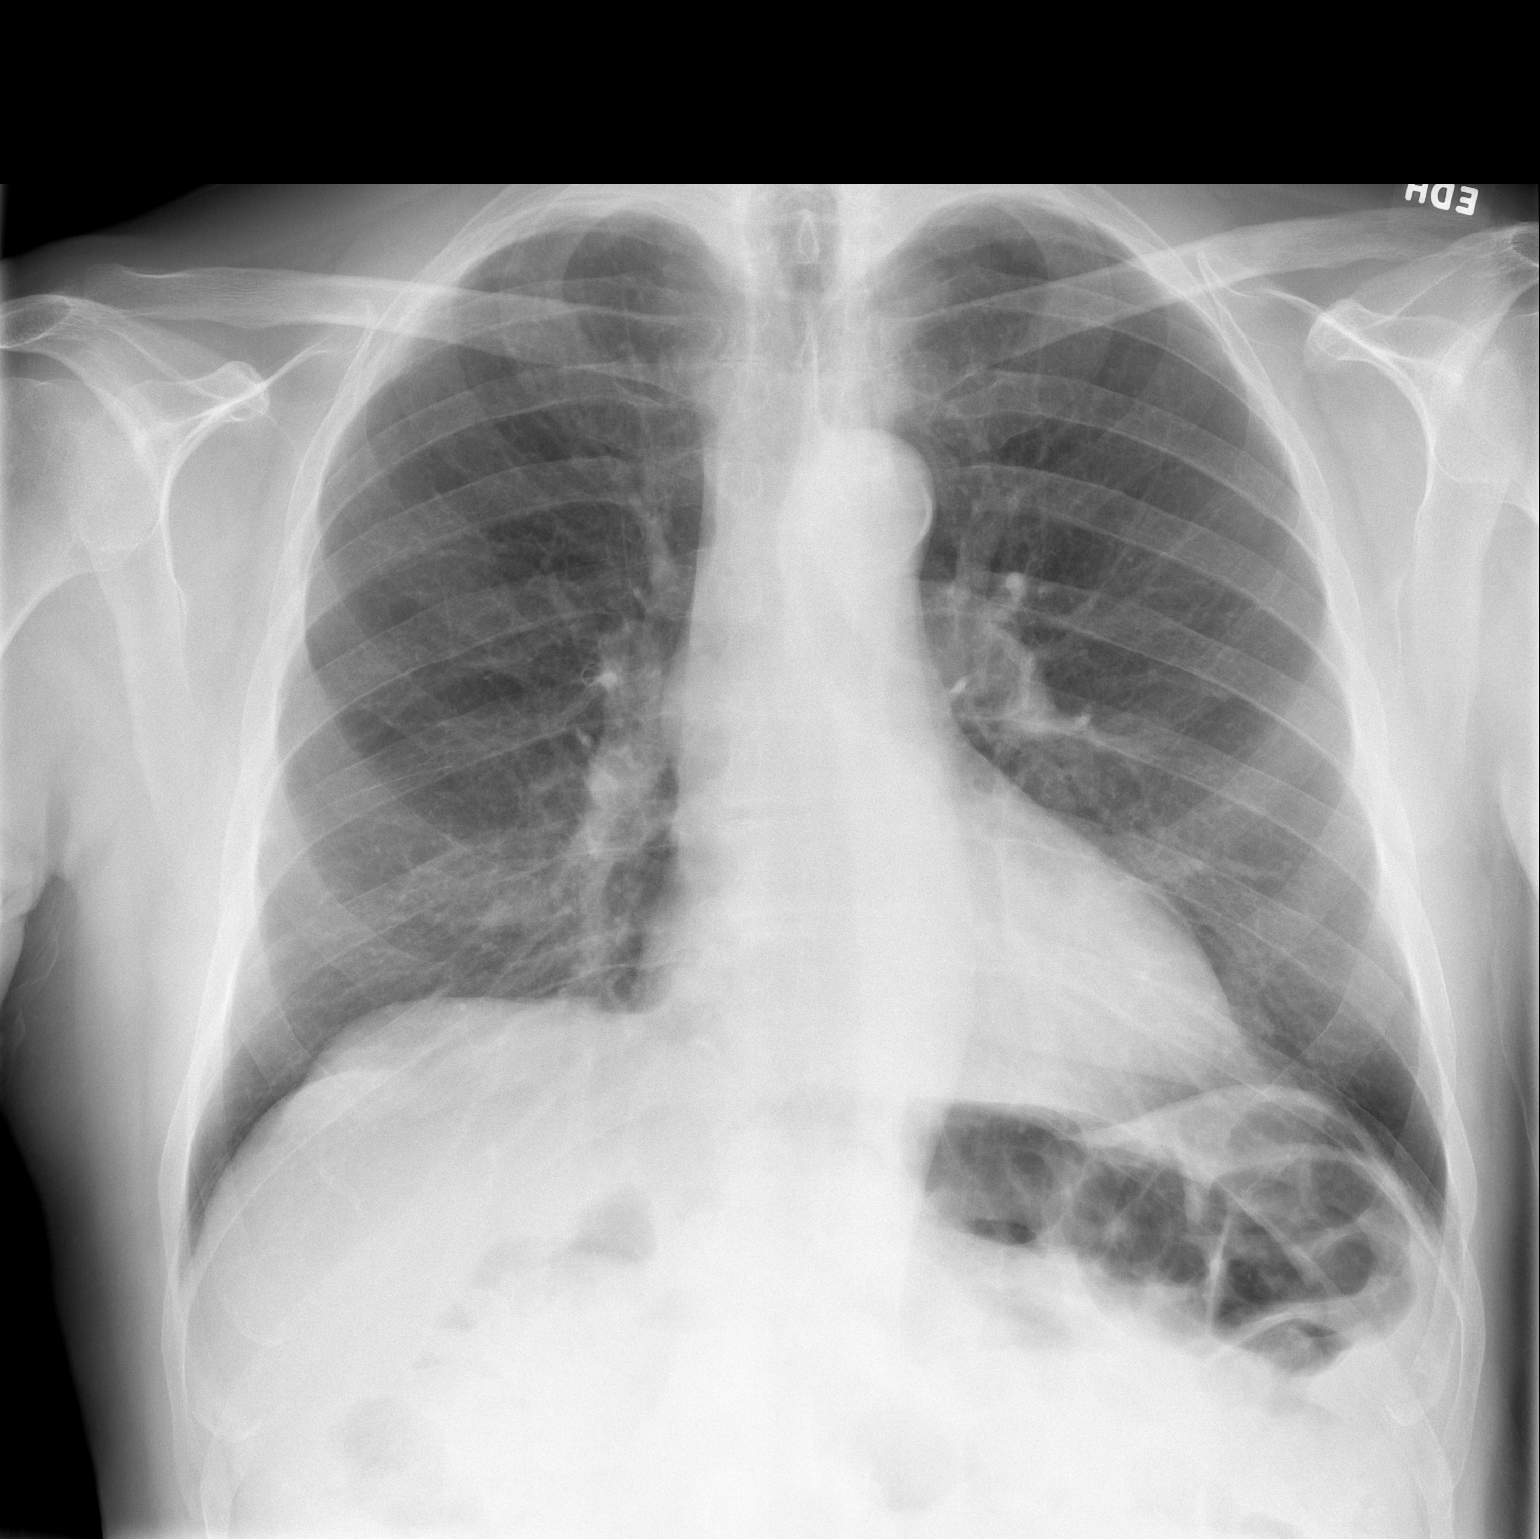

[w chest lat]
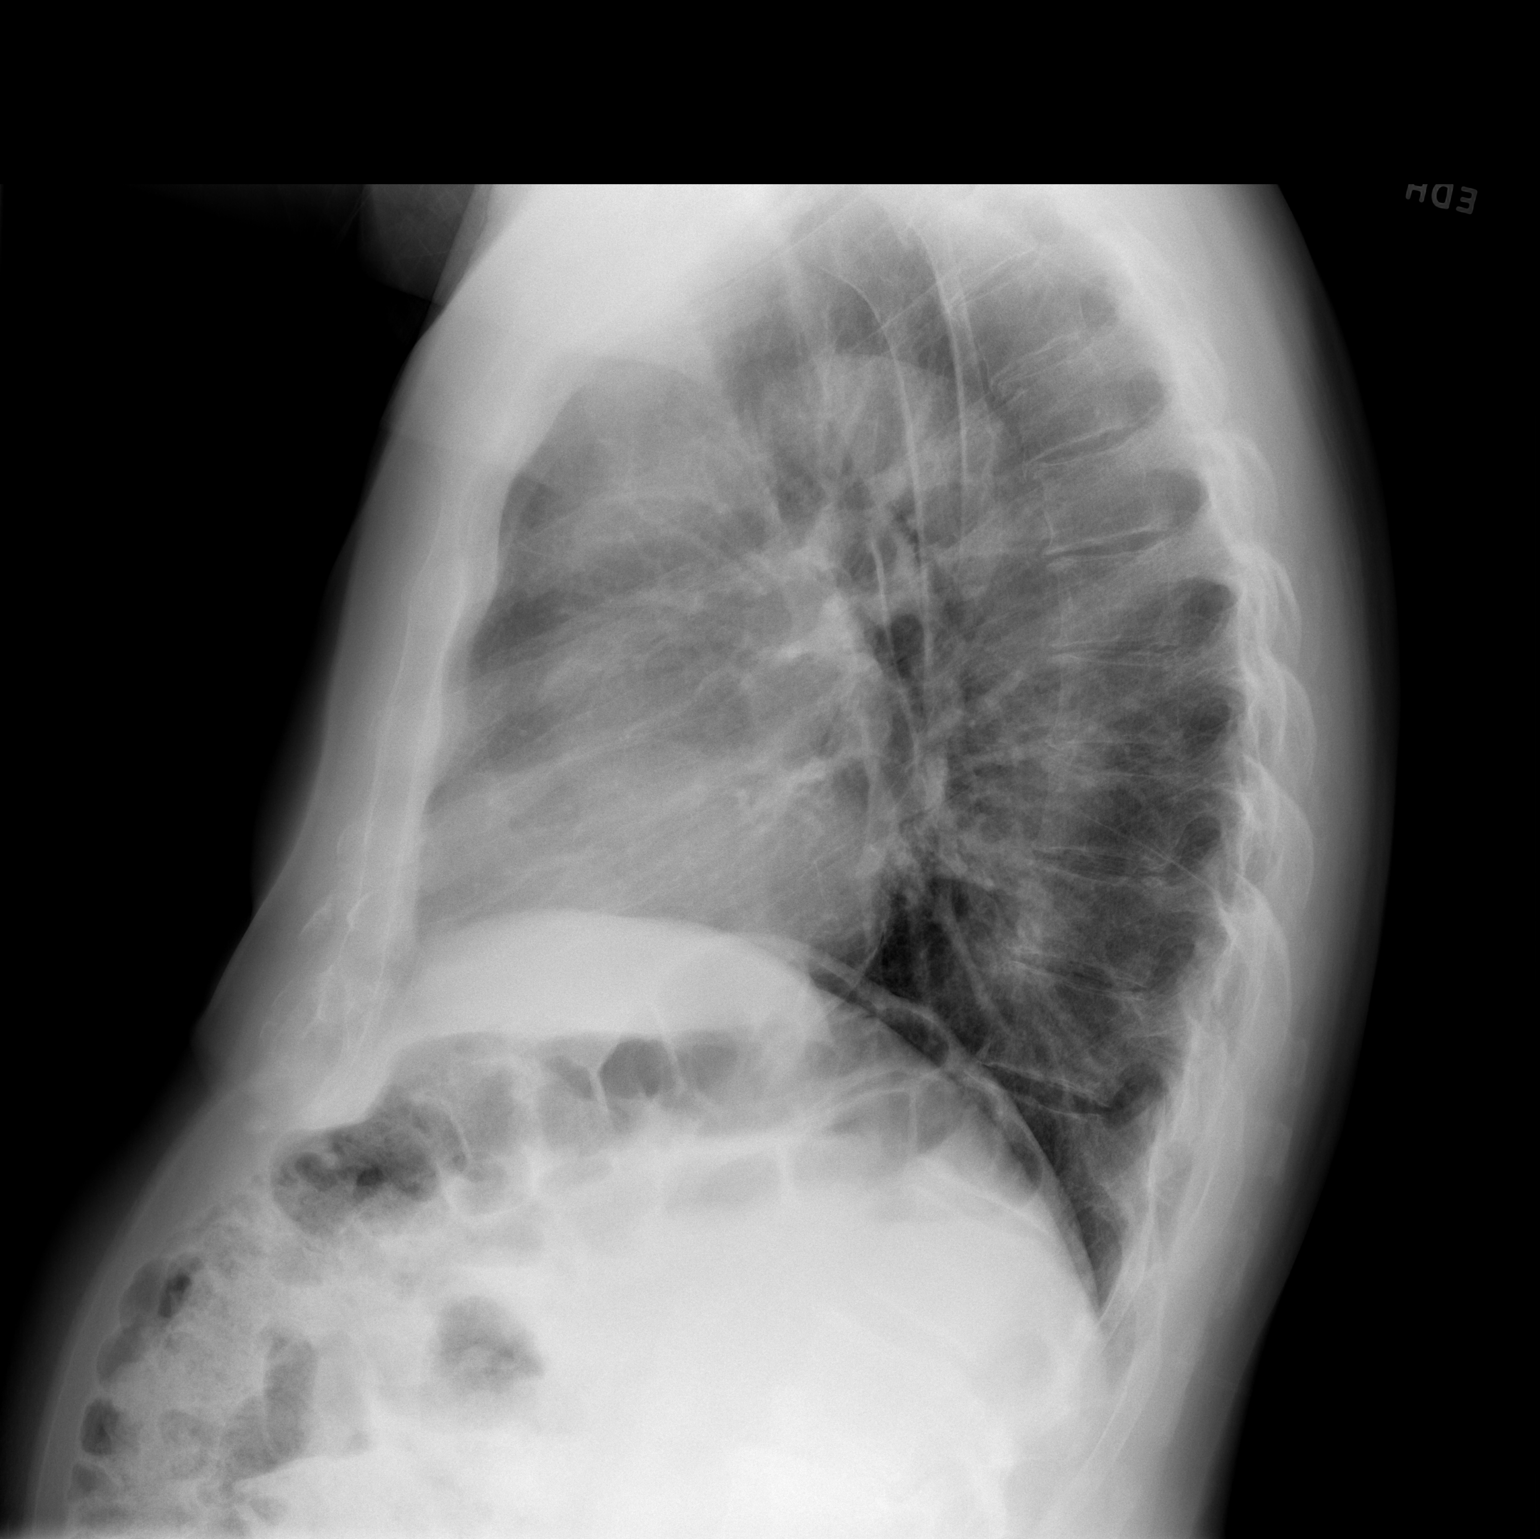

[2 of 2 positions shown; findings below may reference images not displayed]

FINDINGS: Normal sized heart. Tortuous and partially calcified thoracic aorta.
Clear lungs. Mild thoracic spine degenerative changes.
IMPRESSION: No acute abnormality.  No residual airspace consolidation seen.

## 2021-03-10 ENCOUNTER — Encounter: Payer: Self-pay | Admitting: Neurology

## 2021-03-28 NOTE — Progress Notes (Signed)
Assessment/Plan:   1.  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  -Discussed nature and pathophysiology.  Discussed medications.  They decided to hold on that.  2.  Depression and GAD  -Wife really thinks that this is the primary issue and driving tremor.  She states that years ago he had significant somatization of depressive symptoms.  Patient does not disagree.  I discussed with them that certainly anxiety can drive tremor, but generally is not the sole feature.  We discussed the value of multidisciplinary care, including psychiatry and counseling, and the treatment of depression and anxiety.  Resources were provided for them.  -Patient asks about potentially increasing the mirtazapine from 7.5 mg to 15 mg.  I certainly have no objection to that.  He will talk with PCP about this at his appt later today.  Subjective:   George Paul. was seen today in the movement disorders clinic for neurologic consultation at the request of Tally Joe, MD.  The consultation is for the evaluation of rest tremor in the L hand x 2 month.  This patient is accompanied in the office by his spouse who supplements the history.  Tremor: Yes.     How long has it been going on? 2 months - getting worse  At rest or with activation?  Activation/intention  When is it noted the most?  Approximating objects  Fam hx of tremor?  No.  Located where?  Primarily L hand but occasionally in the R.  Some pain in the L arm when it happens at the shoulder but not at the neck  Affected by caffeine:  unknown (drinks 2-3 cups coffee in the AM; tremor isn't bad when wakes up in the AM)  Affected by alcohol:  No. (Drinks 4 glasses/wine per week)  Affected by stress:  Yes.    Spills soup if on spoon:  No. B/c he is R hand dominant and mostly in the L hand   Spills glass of liquid if full:   No.  Affects ADL's (tying shoes, brushing teeth, etc):  No.  Tremor inducing meds:  Yes.   Albuterol (never uses it - given for pneumonia in 06/2019 but not taking now)  Other Specific Symptoms:  Voice: no change Sleep: sleeps with med (lunesta, remeron)  Vivid Dreams:  Yes.    Acting out dreams:  No. Postural symptoms:  "never had good balance" - states that he lost job opportunity 30 years ago b/c couldn't stand on one leg - that has not changed  Falls?  No. Bradykinesia symptoms: no bradykinesia noted Loss of smell:  No. Loss of taste:  No. Urinary Incontinence:  No. Difficulty Swallowing:  No. Trouble with ADL's:  No.  Trouble buttoning clothing: No. Depression:  Yes.  relates hx of depression 30 years ago, relieved by elavil.  When he retired in 2017, had another period of depression and put on mirtazpine  Memory changes:  No. N/V:  No. Lightheaded:  No.  Syncope: No. Diplopia:  No.  Neuroimaging of the brain has not previously been performed in the recent years.  Believed he had one 35+ years ago.    PREVIOUS MEDICATIONS: none to date  ALLERGIES:  No Known Allergies  CURRENT MEDICATIONS:  Current Outpatient Medications  Medication Instructions   albuterol (PROVENTIL HFA;VENTOLIN HFA) 108 (90 BASE) MCG/ACT inhaler 2 puffs, Inhalation, Every 4 hours PRN  benzonatate (TESSALON) 100 mg, Oral, Every 8 hours   eszopiclone (LUNESTA) 3 mg, Oral, Daily at bedtime, Take immediately before bedtime   mirtazapine (REMERON) 7.5 mg, Oral, Daily at bedtime   psyllium (HYDROCIL/METAMUCIL) 95 % PACK 1 packet, Oral, Daily   Spacer/Aero-Holding Chambers (E-Z SPACER) inhaler Use as instructed    Objective:   PHYSICAL EXAMINATION:    VITALS:   Vitals:   03/30/21 0827  BP: 123/82  Pulse: 80  SpO2: 97%  Weight: 188 lb 6.4 oz (85.5 kg)  Height: 5\' 11"  (1.803 m)    GEN:  The patient appears stated age and is in NAD. HEENT:  Normocephalic, atraumatic.  The mucous membranes are  moist. The superficial temporal arteries are without ropiness or tenderness. CV:  RRR Lungs:  CTAB Neck/HEME:  There are no carotid bruits bilaterally.  Neurological examination:  Orientation: The patient is alert and oriented x3.  Cranial nerves: There is good facial symmetry.  Extraocular muscles are intact. The visual fields are full to confrontational testing. The speech is fluent and clear. Soft palate rises symmetrically and there is no tongue deviation. Hearing is intact to conversational tone. Sensation: Sensation is intact to light touch throughout (facial, trunk, extremities). Vibration is decreased at the bilateral big toe. There is no extinction with double simultaneous stimulation.  Motor: Strength is 5/5 in the bilateral upper and lower extremities.   Shoulder shrug is equal and symmetric.  There is no pronator drift. Deep tendon reflexes: Deep tendon reflexes are 2/4 at the bilateral biceps, triceps, brachioradialis, patella and achilles. Plantar responses  is down on the right and up on the L Movement examination: Tone: There is nl tone in the bilateral upper extremities.  The tone in the lower extremities is nl.  Abnormal movements: no rest tremor.  Mild postural tremor.  Mild intention tremor on the left.  No trouble with archimedes spirals.   Coordination:  There is no decremation with RAM's, with any form of RAMS, including alternating supination and pronation of the forearm, hand opening and closing, finger taps, heel taps and toe taps. Gait and Station: The patient has no difficulty arising out of a deep-seated chair without the use of the hands. The patient's stride length is good.   I have reviewed and interpreted the following labs independently   Chemistry      Component Value Date/Time   NA 139 06/24/2019 0420   K 3.8 06/24/2019 0420   CL 108 06/24/2019 0420   CO2 22 06/24/2019 0420   BUN 16 06/24/2019 0420   CREATININE 0.81 06/24/2019 0420      Component Value  Date/Time   CALCIUM 9.2 06/24/2019 0420   ALKPHOS 82 06/24/2019 0420   AST 14 (L) 06/24/2019 0420   ALT 14 06/24/2019 0420   BILITOT 1.3 (H) 06/24/2019 0420      No results found for: TSH Lab Results  Component Value Date   WBC 11.6 (H) 06/25/2019   HGB 15.3 06/25/2019   HCT 46.1 06/25/2019   MCV 94.5 06/25/2019   PLT 143 (L) 06/25/2019      Total time spent on today's visit was 45 min, including both face-to-face time and nonface-to-face time.  Time included that spent on review of records (prior notes available to me/labs/imaging if pertinent), discussing treatment and goals, answering patient's questions and coordinating care.  Cc:  06/27/2019, MD

## 2021-03-30 ENCOUNTER — Other Ambulatory Visit: Payer: Self-pay

## 2021-03-30 ENCOUNTER — Encounter: Payer: Self-pay | Admitting: Neurology

## 2021-03-30 ENCOUNTER — Ambulatory Visit: Payer: Federal, State, Local not specified - PPO | Admitting: Neurology

## 2021-03-30 VITALS — BP 123/82 | HR 80 | Ht 71.0 in | Wt 188.4 lb

## 2021-03-30 DIAGNOSIS — R251 Tremor, unspecified: Secondary | ICD-10-CM | POA: Diagnosis not present

## 2021-03-30 DIAGNOSIS — F331 Major depressive disorder, recurrent, moderate: Secondary | ICD-10-CM

## 2021-03-30 NOTE — Patient Instructions (Signed)
We discussed tremor today.  You don't have Parkinsons Disease.  We discussed that things like stress/anxiety can worsen tremor.  I am giving you a pamphlet regarding resources for counseling if you find it beneficial.  The physicians and staff at [facility] are committed to providing excellent care. You may receive a survey requesting feedback about your experience at our office. We strive to receive "very good" responses to the survey questions. If you feel that your experience would prevent you from giving the office a "very good " response, please contact our office to try to remedy the situation. We may be reached at [phone]. Thank you for taking the time out of your busy day to complete the survey.

## 2021-06-01 ENCOUNTER — Encounter (INDEPENDENT_AMBULATORY_CARE_PROVIDER_SITE_OTHER): Payer: Federal, State, Local not specified - PPO | Admitting: Ophthalmology

## 2022-01-31 IMAGING — DX DG CHEST 2V
2 series · 2 of 2 positions shown · non-contrast
Comparison: 08/11/2019

CLINICAL DATA: Cough and congestion.

EXAM:
CHEST - 2 VIEW

[chest pa]
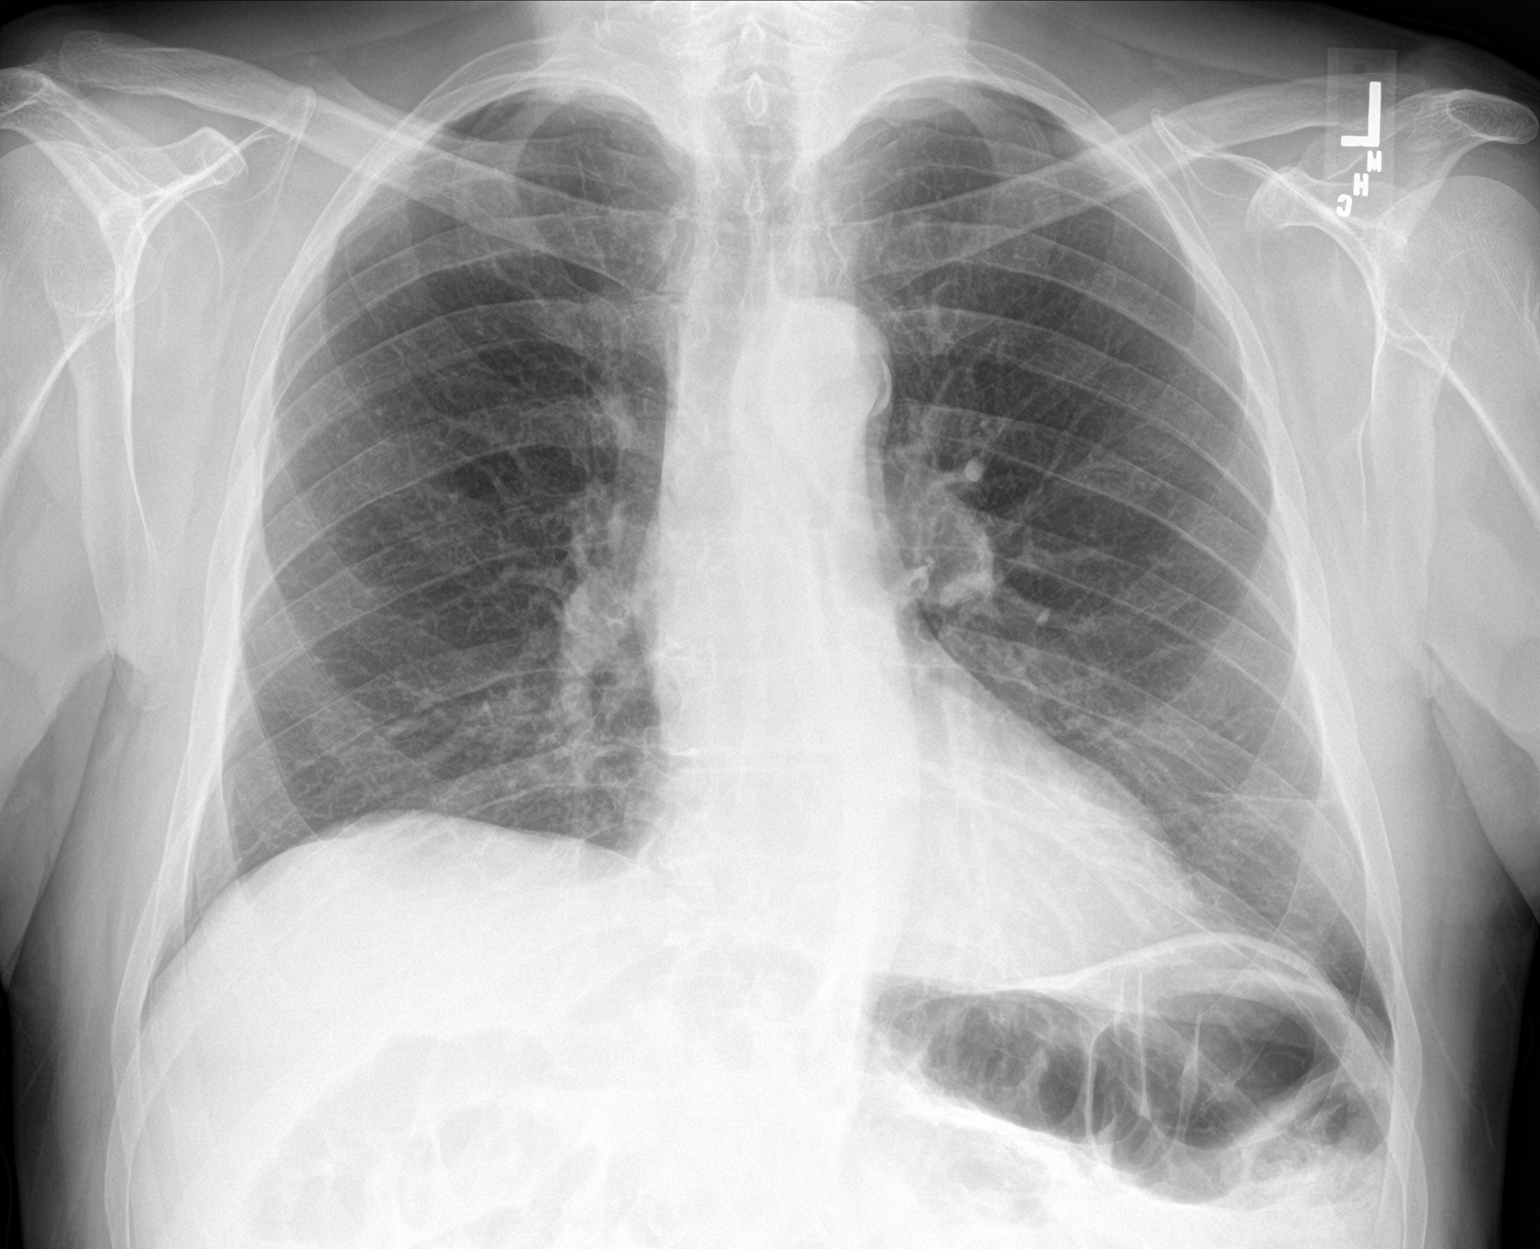

[chest lat]
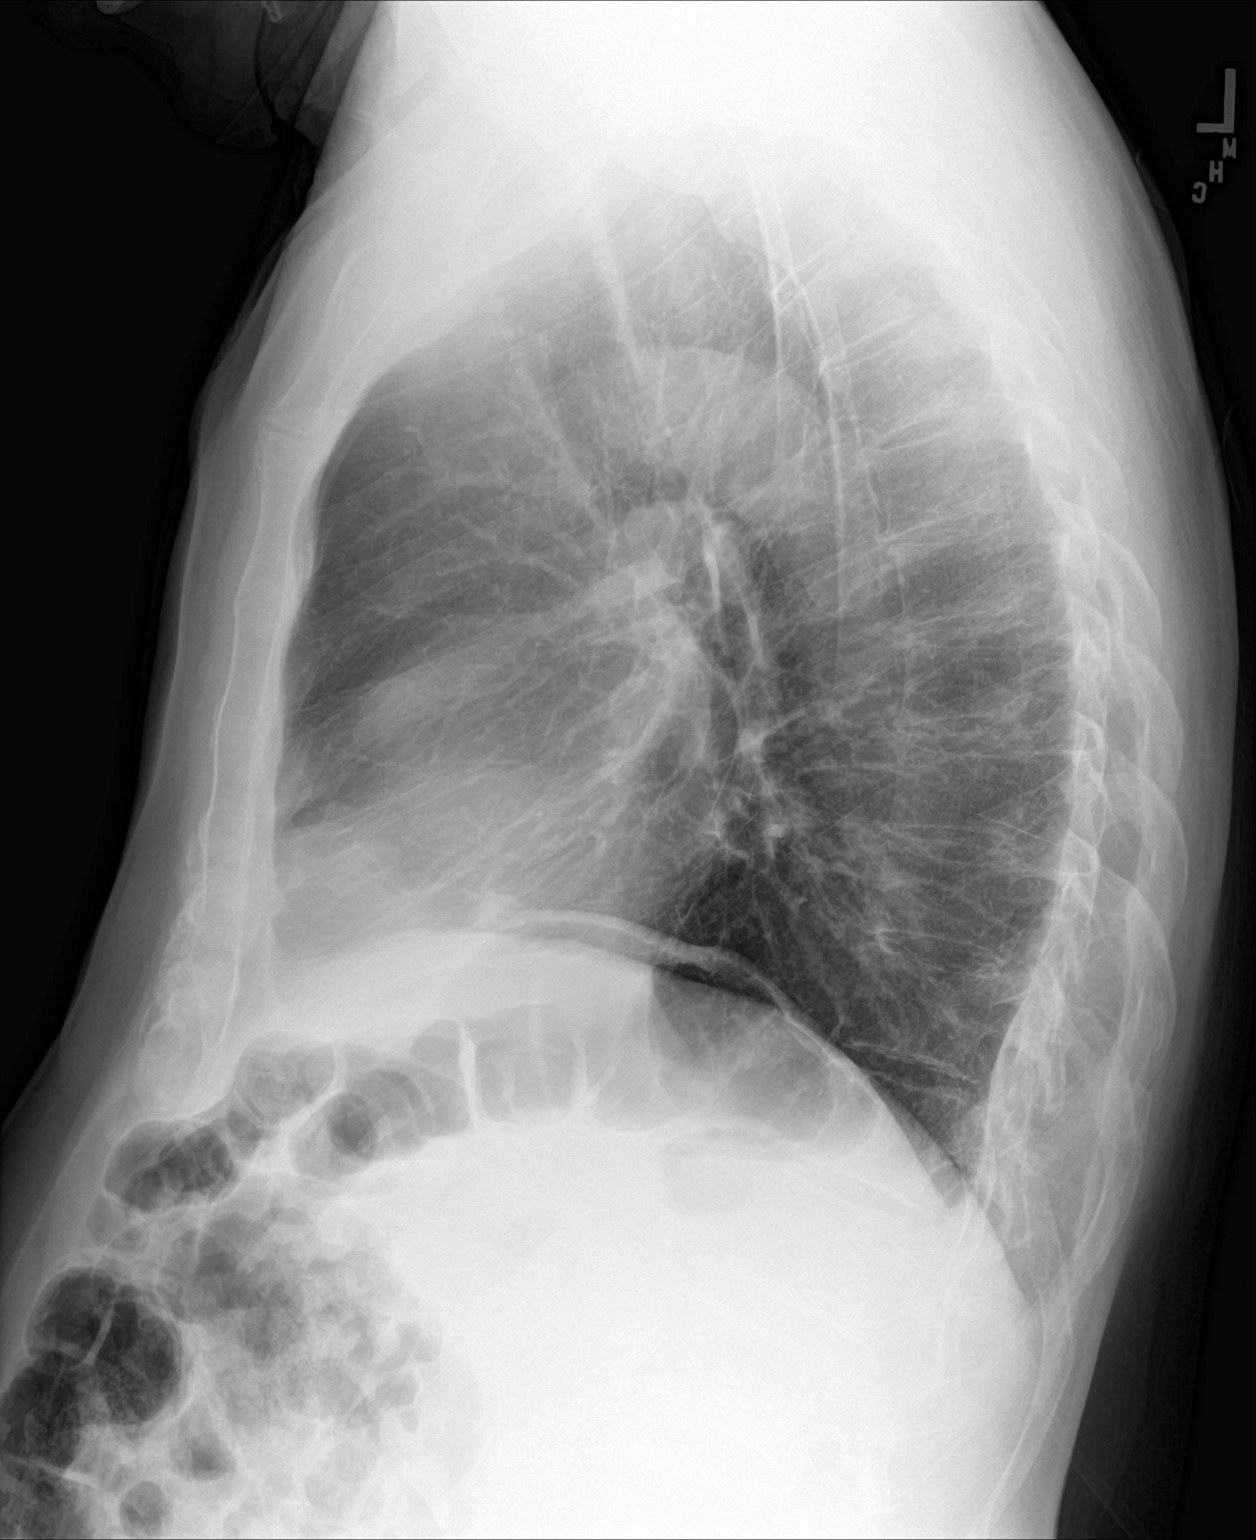

[2 of 2 positions shown; findings below may reference images not displayed]

FINDINGS: The lungs are clear without focal pneumonia, edema, pneumothorax or
pleural effusion. Subsegmental atelectasis or linear scarring noted
left base. The cardiopericardial silhouette is within normal limits
for size. The visualized bony structures of the thorax show no acute
abnormality.
IMPRESSION: No active cardiopulmonary disease.

## 2022-05-08 NOTE — Progress Notes (Unsigned)
Assessment/Plan:    1.  Essential Tremor, mild  -Tremor really does not look different than 1 year ago.  It is mild and asymmetric, with the left being worse than the right.  We discussed nature and pathophysiology.  We discussed medications.  The patient is not interested in that right now.  Tremor is not that bothersome.  2.  Adjustment disorder  -due to death of wife.    -We discussed how anxiety/stress can influence tremor.  Discussed counseling.  Discussed hospice grief services.  He went 1 time to hospice grief services, but did not go back.  I encouraged him to, but I am happy to hear that he is actually engaging in counseling through the [place] 1 time per month.  He is coming up to the 1 year anniversary of his wife's death and it is very hard on him.  Subjective:   George Paul. was seen today in follow up for tremor.  My previous records were reviewed prior to todays visit.  Patient was seen over 1 year ago.  He was referred for the rest tremor, but when we saw him, we did not see evidence of rest tremor at that time.  At that time, he had very little intention and postural tremor on the left, and felt that he likely had some essential tremor.  Wife previously thought that his anxiety was really the primary issue, and felt that this was likely the driver of tremor.  She describes somatization in the past, and the patient did not disagree.  They were going to return to primary care and see if perhaps his mirtazapine could be increased.  We also gave them resources for counseling and psychiatry.  Patient is referred back by the [facility], as tremor has gotten worse over the last 2 months per notes.  Pt states that tremor isn't worse at all but it does come and go.  He has more tremor in the L hand than the R hand.  Notes that working in yard with increase tremor.  Caffeine doesn't seem to make worse, although he drinks 3 cups in the AM.  Stress does make worse - wife passed  away in January.  He did go to hospice grief counseling 1 time.  He is seeking more regular counseling 1 time per month through the [place].    ALLERGIES:  No Known Allergies  CURRENT MEDICATIONS:  Current Meds  Medication Sig   eszopiclone (LUNESTA) 2 MG TABS Take 3 mg by mouth at bedtime. Take immediately before bedtime   mirtazapine (REMERON) 15 MG tablet Take 15 mg by mouth at bedtime.   psyllium (HYDROCIL/METAMUCIL) 95 % PACK Take 1 packet by mouth daily.      Objective:    PHYSICAL EXAMINATION:    VITALS:   Vitals:   05/10/22 1253  BP: 116/68  Pulse: 69  SpO2: 98%  Weight: 190 lb (86.2 kg)  Height: 5\' 11"  (1.803 m)    GEN:  The patient appears stated age and is in NAD. HEENT:  Normocephalic, atraumatic.  The mucous membranes are moist. The superficial temporal arteries are without ropiness or tenderness. CV:  RRR Lungs:  CTAB Neck/HEME:  There are no carotid bruits bilaterally.  Neurological examination:  Orientation: The patient is alert and oriented x3. Cranial nerves: There is good facial symmetry. The speech is fluent and clear. Soft palate rises symmetrically and there is no tongue deviation. Hearing is intact to conversational tone. Sensation:  Sensation is intact to light touch throughout Motor: Strength is at least antigravity x4.  Movement examination: Orientation: The patient is alert and oriented x3.  Cranial nerves: There is good facial symmetry.  Extraocular muscles are intact. The visual fields are full to confrontational testing. The speech is fluent and clear. Soft palate rises symmetrically and there is no tongue deviation. Hearing is intact to conversational tone. Sensation: Sensation is intact to light touch throughout. Motor: Strength is 5/5 in the bilateral upper and lower extremities.   Shoulder shrug is equal and symmetric.  There is no pronator drift.  Movement examination: Tone: There is nl tone in the bilateral upper extremities.  The tone  in the lower extremities is nl.  Abnormal movements: no rest tremor.  Mild postural tremor.  Mild intention tremor on the left.  There is no tremor when he is given a weight, either a heavy weight or light weight.  No trouble with archimedes spirals.   Coordination:  There is no decremation with RAM's, with any form of RAMS, including alternating supination and pronation of the forearm, hand opening and closing, finger taps, heel taps and toe taps. Gait and Station: The patient has no difficulty arising out of a deep-seated chair without the use of the hands. The patient's stride length is good.   I have reviewed and interpreted the following labs independently   Chemistry      Component Value Date/Time   NA 139 06/24/2019 0420   K 3.8 06/24/2019 0420   CL 108 06/24/2019 0420   CO2 22 06/24/2019 0420   BUN 16 06/24/2019 0420   CREATININE 0.81 06/24/2019 0420      Component Value Date/Time   CALCIUM 9.2 06/24/2019 0420   ALKPHOS 82 06/24/2019 0420   AST 14 (L) 06/24/2019 0420   ALT 14 06/24/2019 0420   BILITOT 1.3 (H) 06/24/2019 0420      Lab Results  Component Value Date   WBC 11.6 (H) 06/25/2019   HGB 15.3 06/25/2019   HCT 46.1 06/25/2019   MCV 94.5 06/25/2019   PLT 143 (L) 06/25/2019   No results found for: "TSH"   Chemistry      Component Value Date/Time   NA 139 06/24/2019 0420   K 3.8 06/24/2019 0420   CL 108 06/24/2019 0420   CO2 22 06/24/2019 0420   BUN 16 06/24/2019 0420   CREATININE 0.81 06/24/2019 0420      Component Value Date/Time   CALCIUM 9.2 06/24/2019 0420   ALKPHOS 82 06/24/2019 0420   AST 14 (L) 06/24/2019 0420   ALT 14 06/24/2019 0420   BILITOT 1.3 (H) 06/24/2019 0420       Cc:  Antony Contras, MD

## 2022-05-10 ENCOUNTER — Encounter: Payer: Self-pay | Admitting: Neurology

## 2022-05-10 ENCOUNTER — Ambulatory Visit: Payer: Federal, State, Local not specified - PPO | Admitting: Neurology

## 2022-05-10 VITALS — BP 116/68 | HR 69 | Ht 71.0 in | Wt 190.0 lb

## 2022-05-10 DIAGNOSIS — G250 Essential tremor: Secondary | ICD-10-CM | POA: Diagnosis not present

## 2022-05-10 DIAGNOSIS — F4321 Adjustment disorder with depressed mood: Secondary | ICD-10-CM | POA: Diagnosis not present

## 2022-05-10 NOTE — Patient Instructions (Signed)
Essential Tremor ?A tremor is trembling or shaking that a person cannot control. Most tremors affect the hands or arms. Tremors can also affect the head, vocal cords, legs, and other parts of the body. Essential tremor is a tremor without a known cause. Usually, it occurs while a person is trying to perform an action. It tends to get worse gradually as a person ages. ?What are the causes? ?The cause of this condition is not known, but it often runs in families. ?What increases the risk? ?You are more likely to develop this condition if: ?You have a family member with essential tremor. ?You are 40 years of age or older. ?What are the signs or symptoms? ?The main sign of a tremor is a rhythmic shaking of certain parts of your body that is uncontrolled and unintentional. You may: ?Have difficulty eating with a spoon or fork. ?Have difficulty writing. ?Nod your head up and down or side to side. ?Have a quivering voice. ?The shaking may: ?Get worse over time. ?Come and go. ?Be more noticeable on one side of your body. ?Get worse due to stress, tiredness (fatigue), caffeine, and extreme heat or cold. ?How is this diagnosed? ?This condition may be diagnosed based on: ?Your symptoms and medical history. ?A physical exam. ?There is no single test to diagnose an essential tremor. However, your health care provider may order tests to rule out other causes of your condition. These may include: ?Blood and urine tests. ?Imaging studies of your brain, such as a CT scan or MRI. ?How is this treated? ?Treatment for essential tremor depends on the severity of the condition. ?Mild tremors may not need treatment if they do not affect your day-to-day life. ?Severe tremors may need to be treated using one or more of the following options: ?Medicines. ?Injections of a substance called botulinum toxin. ?Procedures such as deep brain stimulation (DBS) implantation or MRI-guided ultrasound treatment. ?Lifestyle changes. ?Occupational or  physical therapy. ?Follow these instructions at home: ?Lifestyle ? ?Do not use any products that contain nicotine or tobacco. These products include cigarettes, chewing tobacco, and vaping devices, such as e-cigarettes. If you need help quitting, ask your health care provider. ?Limit your caffeine intake as told by your health care provider. ?Try to get 8 hours of sleep each night. ?Find ways to manage your stress that fit your lifestyle and personality. Consider trying meditation or yoga. ?Try to anticipate stressful situations and allow extra time to manage them. ?If you are struggling emotionally with the effects of your tremor, consider working with a mental health provider. ?General instructions ?Take over-the-counter and prescription medicines only as told by your health care provider. ?Avoid extreme heat and extreme cold. ?Keep all follow-up visits. This is important. Visits may include physical therapy visits. ?Where to find more information ?National Institute of Neurological Disorders and Stroke: www.ninds.nih.gov ?Contact a health care provider if: ?You experience any changes in the location or intensity of your tremors. ?You start having a tremor after starting a new medicine. ?You have a tremor with other symptoms, such as: ?Numbness. ?Tingling. ?Pain. ?Weakness. ?Your tremor gets worse. ?Your tremor interferes with your daily life. ?You feel down, blue, or sad for at least 2 weeks in a row. ?Worrying about your tremor and what other people think about you interferes with your everyday life functions, including relationships, work, or school. ?Summary ?Essential tremor is a tremor without a known cause. Usually, it occurs when you are trying to perform an action. ?You are more likely   to develop this condition if you have a family member with essential tremor. ?The main sign of a tremor is a rhythmic shaking of certain parts of your body that is uncontrolled and unintentional. ?Treatment for essential  tremor depends on the severity of the condition. ?This information is not intended to replace advice given to you by your health care provider. Make sure you discuss any questions you have with your health care provider. ?Document Revised: 04/07/2021 Document Reviewed: 04/07/2021 ?Elsevier Patient Education ? 2023 Elsevier Inc. ? ?

## 2022-06-05 ENCOUNTER — Encounter (INDEPENDENT_AMBULATORY_CARE_PROVIDER_SITE_OTHER): Payer: Federal, State, Local not specified - PPO | Admitting: Ophthalmology

## 2022-06-05 DIAGNOSIS — H353131 Nonexudative age-related macular degeneration, bilateral, early dry stage: Secondary | ICD-10-CM

## 2022-06-05 DIAGNOSIS — H43813 Vitreous degeneration, bilateral: Secondary | ICD-10-CM | POA: Diagnosis not present

## 2022-06-14 ENCOUNTER — Encounter (INDEPENDENT_AMBULATORY_CARE_PROVIDER_SITE_OTHER): Payer: Federal, State, Local not specified - PPO | Admitting: Ophthalmology

## 2022-06-14 DIAGNOSIS — H26491 Other secondary cataract, right eye: Secondary | ICD-10-CM | POA: Diagnosis not present

## 2022-07-05 ENCOUNTER — Encounter (INDEPENDENT_AMBULATORY_CARE_PROVIDER_SITE_OTHER): Payer: Federal, State, Local not specified - PPO | Admitting: Ophthalmology

## 2022-07-05 DIAGNOSIS — Z961 Presence of intraocular lens: Secondary | ICD-10-CM

## 2022-07-06 ENCOUNTER — Emergency Department (HOSPITAL_BASED_OUTPATIENT_CLINIC_OR_DEPARTMENT_OTHER): Payer: Federal, State, Local not specified - PPO | Admitting: Radiology

## 2022-07-06 ENCOUNTER — Other Ambulatory Visit: Payer: Self-pay

## 2022-07-06 ENCOUNTER — Encounter (HOSPITAL_BASED_OUTPATIENT_CLINIC_OR_DEPARTMENT_OTHER): Payer: Self-pay | Admitting: Emergency Medicine

## 2022-07-06 ENCOUNTER — Emergency Department (HOSPITAL_BASED_OUTPATIENT_CLINIC_OR_DEPARTMENT_OTHER)
Admission: EM | Admit: 2022-07-06 | Discharge: 2022-07-06 | Disposition: A | Discharge status: Home / Self Care | Payer: Federal, State, Local not specified - PPO | Attending: Emergency Medicine | Admitting: Emergency Medicine

## 2022-07-06 DIAGNOSIS — J101 Influenza due to other identified influenza virus with other respiratory manifestations: Secondary | ICD-10-CM | POA: Insufficient documentation

## 2022-07-06 DIAGNOSIS — Z1152 Encounter for screening for COVID-19: Secondary | ICD-10-CM | POA: Diagnosis not present

## 2022-07-06 DIAGNOSIS — J189 Pneumonia, unspecified organism: Secondary | ICD-10-CM | POA: Diagnosis not present

## 2022-07-06 DIAGNOSIS — J111 Influenza due to unidentified influenza virus with other respiratory manifestations: Secondary | ICD-10-CM

## 2022-07-06 DIAGNOSIS — R059 Cough, unspecified: Secondary | ICD-10-CM | POA: Diagnosis present

## 2022-07-06 LAB — CBC WITH DIFFERENTIAL/PLATELET
Abs Immature Granulocytes: 0.05 10*3/uL (ref 0.00–0.07)
Basophils Absolute: 0 10*3/uL (ref 0.0–0.1)
Basophils Relative: 0 %
Eosinophils Absolute: 0 10*3/uL (ref 0.0–0.5)
Eosinophils Relative: 0 %
HCT: 48.1 % (ref 39.0–52.0)
Hemoglobin: 15.8 g/dL (ref 13.0–17.0)
Immature Granulocytes: 1 %
Lymphocytes Relative: 9 %
Lymphs Abs: 0.8 10*3/uL (ref 0.7–4.0)
MCH: 30.9 pg (ref 26.0–34.0)
MCHC: 32.8 g/dL (ref 30.0–36.0)
MCV: 93.9 fL (ref 80.0–100.0)
Monocytes Absolute: 1.2 10*3/uL — ABNORMAL HIGH (ref 0.1–1.0)
Monocytes Relative: 14 %
Neutro Abs: 6.7 10*3/uL (ref 1.7–7.7)
Neutrophils Relative %: 76 %
Platelets: 127 10*3/uL — ABNORMAL LOW (ref 150–400)
RBC: 5.12 MIL/uL (ref 4.22–5.81)
RDW: 13.5 % (ref 11.5–15.5)
WBC: 8.8 10*3/uL (ref 4.0–10.5)
nRBC: 0 % (ref 0.0–0.2)

## 2022-07-06 LAB — COMPREHENSIVE METABOLIC PANEL
ALT: 10 U/L (ref 0–44)
AST: 20 U/L (ref 15–41)
Albumin: 4.5 g/dL (ref 3.5–5.0)
Alkaline Phosphatase: 78 U/L (ref 38–126)
Anion gap: 13 (ref 5–15)
BUN: 17 mg/dL (ref 8–23)
CO2: 22 mmol/L (ref 22–32)
Calcium: 10.1 mg/dL (ref 8.9–10.3)
Chloride: 104 mmol/L (ref 98–111)
Creatinine, Ser: 1.08 mg/dL (ref 0.61–1.24)
GFR, Estimated: >60 mL/min (ref >60)
Glucose, Bld: 107 mg/dL — ABNORMAL HIGH (ref 70–99)
Potassium: 3.9 mmol/L (ref 3.5–5.1)
Sodium: 139 mmol/L (ref 135–145)
Total Bilirubin: 0.8 mg/dL (ref 0.3–1.2)
Total Protein: 7.6 g/dL (ref 6.5–8.1)

## 2022-07-06 LAB — LACTIC ACID, PLASMA: Lactic Acid, Venous: 1.5 mmol/L (ref 0.5–1.9)

## 2022-07-06 LAB — RESP PANEL BY RT-PCR (RSV, FLU A&B, COVID)  RVPGX2
Influenza A by PCR: POSITIVE — AB
Influenza B by PCR: NEGATIVE
Resp Syncytial Virus by PCR: NEGATIVE
SARS Coronavirus 2 by RT PCR: NEGATIVE

## 2022-07-06 MED ORDER — ALBUTEROL SULFATE HFA 108 (90 BASE) MCG/ACT IN AERS
2.0000 | INHALATION_SPRAY | Freq: Four times a day (QID) | RESPIRATORY_TRACT | Status: DC | PRN
  Administered 2022-07-06: 2 via RESPIRATORY_TRACT
  Filled 2022-07-06: qty 6.7

## 2022-07-06 MED ORDER — GUAIFENESIN ER 600 MG PO TB12: 600.0000 mg | ORAL_TABLET | Freq: Two times a day (BID) | ORAL | 0 refills | Status: DC

## 2022-07-06 MED ORDER — BENZONATATE 100 MG PO CAPS: 100.0000 mg | ORAL_CAPSULE | Freq: Three times a day (TID) | ORAL | 0 refills | Status: DC | PRN

## 2022-07-06 MED ORDER — AMOXICILLIN-POT CLAVULANATE 875-125 MG PO TABS
1.0000 | ORAL_TABLET | Freq: Once | ORAL | Status: AC
  Administered 2022-07-06: 1 via ORAL
  Filled 2022-07-06: qty 1

## 2022-07-06 MED ORDER — AEROCHAMBER PLUS FLO-VU LARGE MISC
1.0000 | Freq: Once | Status: AC
  Administered 2022-07-06: 1
  Filled 2022-07-06: qty 1

## 2022-07-06 MED ORDER — AMOXICILLIN-POT CLAVULANATE 875-125 MG PO TABS: 1.0000 | ORAL_TABLET | Freq: Two times a day (BID) | ORAL | 0 refills | Status: DC

## 2022-07-06 MED ORDER — DOXYCYCLINE HYCLATE 100 MG PO TABS
100.0000 mg | ORAL_TABLET | Freq: Once | ORAL | Status: AC
  Administered 2022-07-06: 100 mg via ORAL
  Filled 2022-07-06: qty 1

## 2022-07-06 MED ORDER — BENZONATATE 100 MG PO CAPS
100.0000 mg | ORAL_CAPSULE | Freq: Once | ORAL | Status: AC
  Administered 2022-07-06: 100 mg via ORAL
  Filled 2022-07-06: qty 1

## 2022-07-06 MED ORDER — DOXYCYCLINE HYCLATE 100 MG PO CAPS: 100.0000 mg | ORAL_CAPSULE | Freq: Two times a day (BID) | ORAL | 0 refills | Status: DC

## 2022-07-06 NOTE — ED Provider Triage Note (Signed)
Emergency Medicine Provider Triage Evaluation Note  George Paul , a 77 y.o. male  was evaluated in triage.  Pt complains of productive cough, fever, chills since last night. No runny nose, sore throat. Reports shortness of breath, no chest pain. Reports sick contacts. Took tylenol several hours ago.   Review of Systems  Positive: SOB, cough Negative: Chest pain  Physical Exam  BP (!) 153/71 (BP Location: Right Arm)   Pulse (!) 105   Temp 98.5 F (36.9 C) (Oral)   Resp (!) 22   Wt 86 kg   SpO2 99%   BMI 26.44 kg/m  Gen:   Awake, no distress   Resp:  Normal effort  MSK:   Moves extremities without difficulty  Other:  Coarse lung sounds in RUL  Medical Decision Making  Medically screening exam initiated at 4:11 PM.  Appropriate orders placed.  George Paul. was informed that the remainder of the evaluation will be completed by another provider, this initial triage assessment does not replace that evaluation, and the importance of remaining in the ED until their evaluation is complete.     Osvaldo Shipper, [place] 07/06/22 1611

## 2022-07-06 NOTE — ED Triage Notes (Signed)
Pt presents for fever, chills, cough since last night. Recent family sick with URI. Took tylenol 2 hrs ago.

## 2022-07-06 NOTE — ED Notes (Signed)
RT educated pt on proper use of MDI w/spacer. Pt also educated on proper use of IS w/goal set at 1500 mLs pt able to achieve 1750 mLs w/good effort. Pt respiratory status stable on RA w/no distress noted at this time.

## 2022-07-06 NOTE — ED Provider Notes (Signed)
[facility] EMERGENCY DEPT Provider Note   CSN: [id number] Arrival date & time: 07/06/22  1559     History  Chief Complaint  Patient presents with   Cough    George Paul. is a 77 y.o. male, no pertinent past medical history, who presents to the ED secondary to cough, fatigue, chills, for the last day.  He states that his family has recently had upper respiratory infection, and he does not know if he got it.  He does have a history of pneumonia which she has had to be hospitalized for.  Denies any kind of runny nose, sore throat, chest pain.  Does state he does have some shortness of breath.  Not an asthmatic, tobacco user, or COPD patient.  Last took Tylenol 2 hours ago.Marland Kitchen     Home Medications Prior to Admission medications   Medication Sig Start Date End Date Taking? Authorizing Provider  amoxicillin-clavulanate (AUGMENTIN) 875-125 MG tablet Take 1 tablet by mouth every 12 (twelve) hours. 07/06/22  Yes Omunique Pederson L, PA  benzonatate (TESSALON) 100 MG capsule Take 1 capsule (100 mg total) by mouth 3 (three) times daily as needed for cough. 07/06/22  Yes Grecia Lynk L, PA  doxycycline (VIBRAMYCIN) 100 MG capsule Take 1 capsule (100 mg total) by mouth 2 (two) times daily. 07/06/22  Yes Carey Johndrow L, PA  guaiFENesin (MUCINEX) 600 MG 12 hr tablet Take 1 tablet (600 mg total) by mouth 2 (two) times daily. 07/06/22  Yes Karlie Aung L, PA  eszopiclone (LUNESTA) 2 MG TABS Take 3 mg by mouth at bedtime. Take immediately before bedtime    [provider]  mirtazapine (REMERON) 15 MG tablet Take 15 mg by mouth at bedtime. 04/06/19   [provider]  psyllium (HYDROCIL/METAMUCIL) 95 % PACK Take 1 packet by mouth daily. 06/26/19   Thomasenia Bottoms, MD      Allergies    Patient has no known allergies.    Review of Systems   Review of Systems  Respiratory:  Positive for cough.     Physical Exam Updated Vital Signs BP (!) 157/69 (BP Location: Right Arm)    Pulse 90   Temp 98.5 F (36.9 C) (Oral)   Resp (!) 23   Wt 86 kg   SpO2 95%   BMI 26.44 kg/m  Physical Exam Vitals and nursing note reviewed.  Constitutional:      General: He is not in acute distress.    Appearance: He is well-developed.  HENT:     Head: Normocephalic and atraumatic.  Eyes:     Conjunctiva/sclera: Conjunctivae normal.  Cardiovascular:     Rate and Rhythm: Normal rate and regular rhythm.     Heart sounds: No murmur heard. Pulmonary:     Effort: Pulmonary effort is normal. No respiratory distress.     Breath sounds: Examination of the right-upper field reveals rales. Examination of the right-lower field reveals rales. Rales present.     Comments: +productive cough Abdominal:     Palpations: Abdomen is soft.     Tenderness: There is no abdominal tenderness.  Musculoskeletal:        General: No swelling.     Cervical back: Neck supple.  Skin:    General: Skin is warm and dry.     Capillary Refill: Capillary refill takes less than 2 seconds.  Neurological:     Mental Status: He is alert.  Psychiatric:        Mood and Affect: Mood  normal.     ED Results / Procedures / Treatments   Labs (all labs ordered are listed, but only abnormal results are displayed) Labs Reviewed  RESP PANEL BY RT-PCR (RSV, FLU A&B, COVID)  RVPGX2 - Abnormal; Notable for the following components:      Result Value   Influenza A by PCR POSITIVE (*)    All other components within normal limits  COMPREHENSIVE METABOLIC PANEL - Abnormal; Notable for the following components:   Glucose, Bld 107 (*)    All other components within normal limits  CBC WITH DIFFERENTIAL/PLATELET - Abnormal; Notable for the following components:   Platelets 127 (*)    Monocytes Absolute 1.2 (*)    All other components within normal limits  LACTIC ACID, PLASMA  LACTIC ACID, PLASMA    EKG None  Radiology DG Chest 2 View  Result Date: 07/06/2022 CLINICAL DATA:  Coarse lung sounds in the right  upper lobe and fever EXAM: CHEST - 2 VIEW COMPARISON:  10/18/2020 FINDINGS: Stable cardiomediastinal silhouette. Aortic atherosclerotic calcification. New airspace opacity in the right lower lung medially may be atelectasis though pneumonia is difficult to exclude. Subsegmental atelectasis left lung base. Biapical pleural-parenchymal scarring. Pleural effusion or pneumothorax. IMPRESSION: New airspace opacity in the right lower lung medially may be atelectasis though pneumonia is difficult to exclude. Follow-up is recommended to ensure resolution. Electronically Signed   By: Minerva Fester M.D.   On: 07/06/2022 18:56    Procedures Procedures    Medications Ordered in ED Medications  amoxicillin-clavulanate (AUGMENTIN) 875-125 MG per tablet 1 tablet (has no administration in time range)  doxycycline (VIBRA-TABS) tablet 100 mg (has no administration in time range)  albuterol (VENTOLIN HFA) 108 (90 Base) MCG/ACT inhaler 2 puff (has no administration in time range)  AeroChamber Plus Flo-Vu Large MISC 1 each (has no administration in time range)  benzonatate (TESSALON) capsule 100 mg (100 mg Oral Given 07/06/22 1613)    ED Course/ Medical Decision Making/ A&P                           Medical Decision Making Patient is 77 year old male, he was initially tachycardic upon evaluation in triage, and complains of upper respiratory symptoms including shortness of breath, productive cough, fatigue.  We will test for COVID/flu, to obtain chest x-ray, and labs given his tachycardia.  He denies any chest pain.  Amount and/or Complexity of Data Reviewed Labs: ordered.    Details: Labs positive for influenza. Radiology: ordered.    Details: Chest x-ray concerning for right lower lobe pneumonia Discussion of management or test interpretation with external provider(s): Discussed with patient, concern for right lower lobe pneumonia, unclear likely attributed to influenza, however given his age, and fatigue, we  will treat with Augmentin, doxycycline, have him follow-up with primary care doctor.  We discussed Tamiflu, he declined it.  Return precautions were emphasized, and he feels better after using Tessalon Perles.  Tessalon Perles were also sent to the pharmacy and he was given albuterol inhaler, incentive spirometer in the ED.  He is well-appearing at discharge, and he does not have elevated BUN, and meets outpatient criteria given the curb 65.  Risk OTC drugs. Prescription drug management.   Final Clinical Impression(s) / ED Diagnoses Final diagnoses:  Pneumonia of right lower lobe due to infectious organism  Influenza    Rx / DC Orders ED Discharge Orders          Ordered  amoxicillin-clavulanate (AUGMENTIN) 875-125 MG tablet  Every 12 hours        07/06/22 2021    doxycycline (VIBRAMYCIN) 100 MG capsule  2 times daily        07/06/22 2021    guaiFENesin (MUCINEX) 600 MG 12 hr tablet  2 times daily        07/06/22 2021    benzonatate (TESSALON) 100 MG capsule  3 times daily PRN        07/06/22 2021              Talita Recht, [place]ley [place], PA 07/06/22 2026    Terrilee Files, MD 07/07/22 1105

## 2022-07-06 NOTE — Discharge Instructions (Addendum)
Please follow-up with your primary care doctor if you have worsening shortness of breath, chest pain, oxygen sats that fall below 90%, or recurrent falls please return to the ER.

## 2023-04-26 ENCOUNTER — Emergency Department (HOSPITAL_BASED_OUTPATIENT_CLINIC_OR_DEPARTMENT_OTHER): Payer: Medicare Other

## 2023-04-26 ENCOUNTER — Other Ambulatory Visit: Payer: Self-pay

## 2023-04-26 ENCOUNTER — Encounter (HOSPITAL_BASED_OUTPATIENT_CLINIC_OR_DEPARTMENT_OTHER): Payer: Self-pay | Admitting: *Deleted

## 2023-04-26 ENCOUNTER — Inpatient Hospital Stay (HOSPITAL_BASED_OUTPATIENT_CLINIC_OR_DEPARTMENT_OTHER)
Admission: EM | Admit: 2023-04-26 | Discharge: 2023-04-30 | DRG: 190 | Disposition: A | Discharge status: Home / Self Care | Payer: Medicare Other | Attending: Internal Medicine | Admitting: Internal Medicine

## 2023-04-26 ENCOUNTER — Emergency Department (HOSPITAL_BASED_OUTPATIENT_CLINIC_OR_DEPARTMENT_OTHER): Payer: Medicare Other | Admitting: Radiology

## 2023-04-26 DIAGNOSIS — J441 Chronic obstructive pulmonary disease with (acute) exacerbation: Secondary | ICD-10-CM | POA: Diagnosis not present

## 2023-04-26 DIAGNOSIS — J440 Chronic obstructive pulmonary disease with acute lower respiratory infection: Secondary | ICD-10-CM | POA: Diagnosis present

## 2023-04-26 DIAGNOSIS — Z87891 Personal history of nicotine dependence: Secondary | ICD-10-CM

## 2023-04-26 DIAGNOSIS — Z66 Do not resuscitate: Secondary | ICD-10-CM | POA: Diagnosis present

## 2023-04-26 DIAGNOSIS — F32A Depression, unspecified: Secondary | ICD-10-CM | POA: Diagnosis present

## 2023-04-26 DIAGNOSIS — E785 Hyperlipidemia, unspecified: Secondary | ICD-10-CM | POA: Diagnosis not present

## 2023-04-26 DIAGNOSIS — Z9842 Cataract extraction status, left eye: Secondary | ICD-10-CM | POA: Diagnosis not present

## 2023-04-26 DIAGNOSIS — Z1152 Encounter for screening for COVID-19: Secondary | ICD-10-CM | POA: Diagnosis not present

## 2023-04-26 DIAGNOSIS — Z811 Family history of alcohol abuse and dependence: Secondary | ICD-10-CM | POA: Diagnosis not present

## 2023-04-26 DIAGNOSIS — G4700 Insomnia, unspecified: Secondary | ICD-10-CM | POA: Diagnosis not present

## 2023-04-26 DIAGNOSIS — J189 Pneumonia, unspecified organism: Principal | ICD-10-CM | POA: Diagnosis present

## 2023-04-26 DIAGNOSIS — Z8249 Family history of ischemic heart disease and other diseases of the circulatory system: Secondary | ICD-10-CM | POA: Diagnosis not present

## 2023-04-26 DIAGNOSIS — Z79899 Other long term (current) drug therapy: Secondary | ICD-10-CM

## 2023-04-26 DIAGNOSIS — Z6372 Alcoholism and drug addiction in family: Secondary | ICD-10-CM | POA: Diagnosis not present

## 2023-04-26 DIAGNOSIS — I4510 Unspecified right bundle-branch block: Secondary | ICD-10-CM | POA: Diagnosis present

## 2023-04-26 DIAGNOSIS — Z9841 Cataract extraction status, right eye: Secondary | ICD-10-CM | POA: Diagnosis not present

## 2023-04-26 DIAGNOSIS — N400 Enlarged prostate without lower urinary tract symptoms: Secondary | ICD-10-CM | POA: Diagnosis present

## 2023-04-26 LAB — COMPREHENSIVE METABOLIC PANEL
ALT: 9 U/L (ref 0–44)
AST: 15 U/L (ref 15–41)
Albumin: 4.8 g/dL (ref 3.5–5.0)
Alkaline Phosphatase: 83 U/L (ref 38–126)
Anion gap: 10 (ref 5–15)
BUN: 11 mg/dL (ref 8–23)
CO2: 26 mmol/L (ref 22–32)
Calcium: 10.4 mg/dL — ABNORMAL HIGH (ref 8.9–10.3)
Chloride: 103 mmol/L (ref 98–111)
Creatinine, Ser: 0.88 mg/dL (ref 0.61–1.24)
GFR, Estimated: >60 mL/min (ref >60)
Glucose, Bld: 103 mg/dL — ABNORMAL HIGH (ref 70–99)
Potassium: 3.7 mmol/L (ref 3.5–5.1)
Sodium: 139 mmol/L (ref 135–145)
Total Bilirubin: 0.7 mg/dL (ref 0.3–1.2)
Total Protein: 7.9 g/dL (ref 6.5–8.1)

## 2023-04-26 LAB — RESP PANEL BY RT-PCR (RSV, FLU A&B, COVID)  RVPGX2
Influenza A by PCR: NEGATIVE
Influenza B by PCR: NEGATIVE
Resp Syncytial Virus by PCR: NEGATIVE
SARS Coronavirus 2 by RT PCR: NEGATIVE

## 2023-04-26 LAB — CBC WITH DIFFERENTIAL/PLATELET
Abs Immature Granulocytes: 0.06 10*3/uL (ref 0.00–0.07)
Basophils Absolute: 0 10*3/uL (ref 0.0–0.1)
Basophils Relative: 0 %
Eosinophils Absolute: 0 10*3/uL (ref 0.0–0.5)
Eosinophils Relative: 0 %
HCT: 45.8 % (ref 39.0–52.0)
Hemoglobin: 15.4 g/dL (ref 13.0–17.0)
Immature Granulocytes: 0 %
Lymphocytes Relative: 8 %
Lymphs Abs: 1.2 10*3/uL (ref 0.7–4.0)
MCH: 31.4 pg (ref 26.0–34.0)
MCHC: 33.6 g/dL (ref 30.0–36.0)
MCV: 93.3 fL (ref 80.0–100.0)
Monocytes Absolute: 0.8 10*3/uL (ref 0.1–1.0)
Monocytes Relative: 6 %
Neutro Abs: 12.9 10*3/uL — ABNORMAL HIGH (ref 1.7–7.7)
Neutrophils Relative %: 86 %
Platelets: 154 10*3/uL (ref 150–400)
RBC: 4.91 MIL/uL (ref 4.22–5.81)
RDW: 13.1 % (ref 11.5–15.5)
WBC: 14.9 10*3/uL — ABNORMAL HIGH (ref 4.0–10.5)
nRBC: 0 % (ref 0.0–0.2)

## 2023-04-26 LAB — TROPONIN I (HIGH SENSITIVITY)
Troponin I (High Sensitivity): 4 ng/L (ref <18)
Troponin I (High Sensitivity): 4 ng/L (ref <18)

## 2023-04-26 LAB — BRAIN NATRIURETIC PEPTIDE: B Natriuretic Peptide: 69.8 pg/mL (ref 0.0–100.0)

## 2023-04-26 MED ORDER — ONDANSETRON HCL 4 MG PO TABS: 4.0000 mg | ORAL_TABLET | Freq: Four times a day (QID) | ORAL | Status: DC | PRN

## 2023-04-26 MED ORDER — GUAIFENESIN ER 600 MG PO TB12
600.0000 mg | ORAL_TABLET | Freq: Two times a day (BID) | ORAL | Status: DC
  Administered 2023-04-26 – 2023-04-30 (×8): 600 mg via ORAL
  Filled 2023-04-26 (×8): qty 1

## 2023-04-26 MED ORDER — SODIUM CHLORIDE 0.9 % IV SOLN
500.0000 mg | Freq: Once | INTRAVENOUS | Status: AC
  Administered 2023-04-26: 500 mg via INTRAVENOUS
  Filled 2023-04-26: qty 5

## 2023-04-26 MED ORDER — MIRTAZAPINE 15 MG PO TABS
15.0000 mg | ORAL_TABLET | Freq: Every day | ORAL | Status: DC
Start: 2023-04-26 — End: 2023-04-30
  Administered 2023-04-26 – 2023-04-29 (×4): 15 mg via ORAL
  Filled 2023-04-26 (×4): qty 1

## 2023-04-26 MED ORDER — SENNOSIDES-DOCUSATE SODIUM 8.6-50 MG PO TABS
1.0000 | ORAL_TABLET | Freq: Every evening | ORAL | Status: DC | PRN
Start: 2023-04-26 — End: 2023-04-30

## 2023-04-26 MED ORDER — AEROCHAMBER PLUS FLO-VU SMALL MISC
1.0000 | Freq: Once | Status: AC
  Administered 2023-04-26: 1
  Filled 2023-04-26: qty 1

## 2023-04-26 MED ORDER — ALBUTEROL SULFATE HFA 108 (90 BASE) MCG/ACT IN AERS
2.0000 | INHALATION_SPRAY | RESPIRATORY_TRACT | Status: DC | PRN
  Administered 2023-04-26: 2 via RESPIRATORY_TRACT
  Filled 2023-04-26: qty 6.7

## 2023-04-26 MED ORDER — ONDANSETRON HCL 4 MG/2ML IJ SOLN: 4.0000 mg | Freq: Four times a day (QID) | INTRAMUSCULAR | Status: DC | PRN

## 2023-04-26 MED ORDER — ENOXAPARIN SODIUM 40 MG/0.4ML IJ SOSY
40.0000 mg | PREFILLED_SYRINGE | Freq: Every day | INTRAMUSCULAR | Status: DC
  Administered 2023-04-26 – 2023-04-29 (×4): 40 mg via SUBCUTANEOUS
  Filled 2023-04-26 (×3): qty 0.4

## 2023-04-26 MED ORDER — ZOLPIDEM TARTRATE 5 MG PO TABS
5.0000 mg | ORAL_TABLET | Freq: Every evening | ORAL | Status: DC | PRN
  Administered 2023-04-26 – 2023-04-29 (×4): 5 mg via ORAL
  Filled 2023-04-26 (×4): qty 1

## 2023-04-26 MED ORDER — PREDNISONE 50 MG PO TABS
60.0000 mg | ORAL_TABLET | Freq: Once | ORAL | Status: AC
  Administered 2023-04-26: 60 mg via ORAL
  Filled 2023-04-26: qty 1

## 2023-04-26 MED ORDER — IOHEXOL 350 MG/ML SOLN
80.0000 mL | Freq: Once | INTRAVENOUS | Status: AC | PRN
  Administered 2023-04-26: 80 mL via INTRAVENOUS

## 2023-04-26 MED ORDER — ACETAMINOPHEN 325 MG PO TABS
650.0000 mg | ORAL_TABLET | Freq: Four times a day (QID) | ORAL | Status: DC | PRN
  Administered 2023-04-27 (×2): 650 mg via ORAL
  Filled 2023-04-26 (×2): qty 2

## 2023-04-26 MED ORDER — ACETAMINOPHEN 500 MG PO TABS
1000.0000 mg | ORAL_TABLET | Freq: Once | ORAL | Status: AC
  Administered 2023-04-26: 1000 mg via ORAL
  Filled 2023-04-26: qty 2

## 2023-04-26 MED ORDER — BENZONATATE 100 MG PO CAPS
100.0000 mg | ORAL_CAPSULE | Freq: Once | ORAL | Status: AC
  Administered 2023-04-26: 100 mg via ORAL
  Filled 2023-04-26: qty 1

## 2023-04-26 MED ORDER — IPRATROPIUM-ALBUTEROL 0.5-2.5 (3) MG/3ML IN SOLN
3.0000 mL | Freq: Once | RESPIRATORY_TRACT | Status: AC
  Administered 2023-04-26: 3 mL via RESPIRATORY_TRACT
  Filled 2023-04-26: qty 3

## 2023-04-26 MED ORDER — OXYCODONE-ACETAMINOPHEN 5-325 MG PO TABS
1.0000 | ORAL_TABLET | Freq: Once | ORAL | Status: AC
  Administered 2023-04-26: 1 via ORAL
  Filled 2023-04-26: qty 1

## 2023-04-26 MED ORDER — ACETAMINOPHEN 650 MG RE SUPP: 650.0000 mg | Freq: Four times a day (QID) | RECTAL | Status: DC | PRN

## 2023-04-26 MED ORDER — IBUPROFEN 400 MG PO TABS
400.0000 mg | ORAL_TABLET | Freq: Once | ORAL | Status: AC
  Administered 2023-04-26: 400 mg via ORAL
  Filled 2023-04-26: qty 1

## 2023-04-26 MED ORDER — SODIUM CHLORIDE 0.9 % IV SOLN
1.0000 g | Freq: Once | INTRAVENOUS | Status: AC
  Administered 2023-04-26: 1 g via INTRAVENOUS
  Filled 2023-04-26: qty 10

## 2023-04-26 MED ORDER — SODIUM CHLORIDE 0.9 % IV SOLN
2.0000 g | INTRAVENOUS | Status: AC
  Administered 2023-04-27 – 2023-04-30 (×4): 2 g via INTRAVENOUS
  Filled 2023-04-26 (×4): qty 20

## 2023-04-26 MED ORDER — SODIUM CHLORIDE 0.9 % IV SOLN
500.0000 mg | INTRAVENOUS | Status: DC
  Administered 2023-04-27: 500 mg via INTRAVENOUS
  Filled 2023-04-26: qty 5

## 2023-04-26 NOTE — ED Notes (Signed)
Pt labelled a fall risk due to age.  Pt requested to keep his hard sole shoes on, fall risk band placed on pt wrist,  Call bell and personal belongings within reach. Fall risk care placed outside of door. Pt educated on the importance of calling for assistance before attempting to ambulate.

## 2023-04-26 NOTE — ED Triage Notes (Signed)
Pt has been having URI for a couple of weeks since he took care of his grandson who was sick and home from school for a week (child had pneumonia).  Pt was dx with bronchitis a week ago and placed on antibiotics.  Pt continues to have productive cough and some sob with this.

## 2023-04-26 NOTE — ED Notes (Signed)
Patient transported to CT 

## 2023-04-26 NOTE — ED Notes (Signed)
Pt right lower forearm IV infiltrated while azithromycin was being infused. Pt called out to say his IV was hurting. I went in right away and noticed the swelling to right forearm. I immediately paused the IV.  IV was disconnected. Provider was made aware and stated she would go in and see him. Ice pack was placed on site of infiltration.

## 2023-04-26 NOTE — ED Provider Notes (Signed)
Patient signed out to me at 1530 by Dr. Dalene Seltzer pending CT PE study and reassessment.  In short this is a 77 year old male with no significant past medical history presenting to the emergency department with cough and shortness of breath.  He was initially exposed to his grandson who had similar symptoms and was started on antibiotics for bronchitis last week at urgent care.  He has completed the antibiotics however his symptoms have not improved.  Initially was hemodynamically stable on arrival and at time of signout did spike a fever of 101. Workup showed leukocytosis otherwise within normal range.  Chest x-ray initially showed no acute disease.  CT PE study was ordered to evaluate for PE or occult pneumonia and he will be closely reassessed.  Clinical Course as of 04/26/23 1916  Fri Apr 26, 2023  1833 On ambulatory pulse ox desatted to 91% with some tahcycardia but no significant change in shortness of breath. [VK]  1916 Patient CT showed no evidence of PE.  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  The patient is still tachypneic and is feeling unwell and with failure of outpatient antibiotics will be admitted for IV antibiotics. [VK]    Clinical Course User Index [VK] Rexford Maus, DO      Rexford Maus, [place] 04/26/23 1610

## 2023-04-26 NOTE — ED Provider Notes (Signed)
[place] EMERGENCY DEPARTMENT AT [facility] Provider Note   CSN: [id number] Arrival date & time: 04/26/23  1110     History {Add pertinent medical, surgical, social history, OB history to HPI:1} Chief Complaint  Patient presents with   Bronchitis    George Paul. is a 77 y.o. male.  HPI      Last Friday given doxycycline, no steroids Has taken mucinex. One week of doxy stops today.  3 weeks Monday has had cough Spent week with grandson when he was out of school, diagnosed with pneumonia, by Monday he himself has had a scratchy throat and then cough, congestion. Coughing up phlegm, some clear some cloudy.  A little short of breath too.  No fever. No chest pain.  No nausea or vomiting. No hemoptysis.  No leg pain or swelling  Quit smoking 50 years ago.  No hx of dvt or pe, no hx of asthma, copd.  No hx of needing inhaler.   No recent surgeries, long trips  Home Medications Prior to Admission medications   Medication Sig Start Date End Date Taking? Authorizing Provider  amoxicillin-clavulanate (AUGMENTIN) 875-125 MG tablet Take 1 tablet by mouth every 12 (twelve) hours. 07/06/22   Small, Brooke L, PA  benzonatate (TESSALON) 100 MG capsule Take 1 capsule (100 mg total) by mouth 3 (three) times daily as needed for cough. 07/06/22   Small, Brooke L, PA  doxycycline (VIBRAMYCIN) 100 MG capsule Take 1 capsule (100 mg total) by mouth 2 (two) times daily. 07/06/22   Small, Brooke L, PA  eszopiclone (LUNESTA) 2 MG TABS Take 3 mg by mouth at bedtime. Take immediately before bedtime    [provider]  guaiFENesin (MUCINEX) 600 MG 12 hr tablet Take 1 tablet (600 mg total) by mouth 2 (two) times daily. 07/06/22   Small, Brooke L, PA  mirtazapine (REMERON) 15 MG tablet Take 15 mg by mouth at bedtime. 04/06/19   [provider]  psyllium (HYDROCIL/METAMUCIL) 95 % PACK Take 1 packet by mouth daily. 06/26/19   Thomasenia Bottoms, MD      Allergies    Patient has  no known allergies.    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 EXAM: CHEST - 2 VIEW COMPARISON:  07/06/2022. FINDINGS: Bilateral lung fields are clear. There is elevation of right hemidiaphragm. Bilateral costophrenic angles are clear. Normal cardio-mediastinal silhouette. No acute osseous abnormalities. The soft tissues are within normal limits. IMPRESSION: *No active cardiopulmonary disease. Electronically Signed   By: Jules Schick M.D.   On: 04/26/2023 12:56    Procedures Procedures  {Document cardiac monitor, telemetry assessment procedure when appropriate:1}  Medications  Ordered in ED Medications  albuterol (VENTOLIN HFA) 108 (90 Base) MCG/ACT inhaler 2 puff (has no administration in time range)    ED Course/ Medical Decision Making/ A&P   {   Click here for ABCD2, HEART and other calculatorsREFRESH Note before signing :1}                              Medical Decision Making Amount and/or Complexity of Data Reviewed Radiology: ordered.  Risk Prescription drug management.   ***  {Document critical care time when appropriate:1} {Document review of labs and clinical decision tools ie heart score, Chads2Vasc2 etc:1}  {Document your  independent review of radiology images, and any outside records:1} {Document your discussion with family members, caretakers, and with consultants:1} {Document social determinants of health affecting pt's care:1} {Document your decision making why or why not admission, treatments were needed:1} Final Clinical Impression(s) / ED Diagnoses Final diagnoses:  None    Rx / DC Orders ED Discharge Orders     None

## 2023-04-26 NOTE — ED Notes (Signed)
RT Note: Patient was ambulated on room air with these results:  Patient oxygen saturation while on room air at rest =94%, HR 95, RR 18 Patient oxygen saturation while ambulating on room air= 91%, HR 113, RR 20  Patient tolerated well with no distress

## 2023-04-26 NOTE — H&P (Signed)
History and Physical    George Paul. [id number] DOB: 06-Jan-1946 DOA: 04/26/2023  PCP: Tally Joe, MD  Patient coming from: Home  I have personally briefly reviewed patient's old medical records in [facility]  Chief Complaint: Cough, shortness of breath  HPI: George Paul. is a 77 y.o. male with no known significant medical history who presented to the ED for evaluation of cough and shortness of breath.  Patient reports he has been having a frequent cough ongoing for the last 3 weeks.  Mostly dry cough but occasionally has clear to thick white sputum production.  He has been feeling short of breath with exertion.  Today he developed diaphoresis.  He is reportedly started on antibiotics for bronchitis last week after visiting urgent care.  He completed antibiotics however symptoms persisted therefore came to the ED for further evaluation.  Patient denies any known chronic medical conditions.  His only regular medications are Lunesta and mirtazapine for sleep.  [facility] ED Course  Labs/Imaging on admission: I have personally reviewed following labs and imaging studies.  Initial vitals showed BP 176/83, pulse 82, RR 16, temp 98.9 F, SpO2 95% on room air.  Tmax 101.0 F while in the ED.  Labs showed WBC 14.9, hemoglobin 15.4, platelets 154,000, sodium 139, potassium 3.7, bicarb 26, BUN 11, creatinine 0.88, serum glucose 103, LFTs within normal limits, troponin negative x 2.  SARS-CoV-2, influenza, RSV PCR negative.  BNP 69.8.  CTA chest was negative for evidence of acute PE.  Segmental peribronchial thickening in the right lower lobe with peribronchial narrowing at the level of the hilum seen, findings progressive from 2020 fairing chronic bronchitis or aspiration pneumonitis.  Superinfection of the right lower lobe noted.  Stable nodule in the azygoesophageal recess and stable hypodense lesions in the liver, favored benign.  Patient was given IV  ceftriaxone and azithromycin, prednisone 60 mg, DuoNeb treatment.  The hospitalist service was consulted to admit for further evaluation and management.  Review of Systems: All systems reviewed and are negative except as documented in history of present illness above.   Past Medical History:  Diagnosis Date   Cataract    Depression    Inguinal hernia    bilateral   Umbilical hernia     Past Surgical History:  Procedure Laterality Date   arm surgery  2002   CATARACT EXTRACTION, BILATERAL  2012   EYE SURGERY  1964   INGUINAL HERNIA REPAIR  04/15/09   left indirect   INGUINAL HERNIA REPAIR  01/18/11   right indirect   TONSILLECTOMY  1959   UMBILICAL HERNIA REPAIR  1998    Social History:  reports that he has quit smoking. He has never used smokeless tobacco. He reports current alcohol use of about 3.0 - 4.0 standard drinks of alcohol per week. He reports that he does not use drugs.  No Known Allergies  Family History  Problem Relation Age of Onset   Heart disease Mother    Alcoholism Mother    Heart failure Maternal Grandmother    Heart failure Maternal Grandfather    ADD / ADHD Son    ADD / ADHD Son      Prior to Admission medications   Medication Sig Start Date End Date Taking? Authorizing Provider  amoxicillin-clavulanate (AUGMENTIN) 875-125 MG tablet Take 1 tablet by mouth every 12 (twelve) hours. 07/06/22   Small, Brooke L, PA  benzonatate (TESSALON) 100 MG capsule Take 1 capsule (100 mg total)  by mouth 3 (three) times daily as needed for cough. 07/06/22   Small, Brooke L, PA  doxycycline (VIBRAMYCIN) 100 MG capsule Take 1 capsule (100 mg total) by mouth 2 (two) times daily. 07/06/22   Small, Brooke L, PA  eszopiclone (LUNESTA) 2 MG TABS Take 3 mg by mouth at bedtime. Take immediately before bedtime    [provider]  guaiFENesin (MUCINEX) 600 MG 12 hr tablet Take 1 tablet (600 mg total) by mouth 2 (two) times daily. 07/06/22   Small, Brooke L, PA  mirtazapine  (REMERON) 15 MG tablet Take 15 mg by mouth at bedtime. 04/06/19   [provider]  psyllium (HYDROCIL/METAMUCIL) 95 % PACK Take 1 packet by mouth daily. 06/26/19   Thomasenia Bottoms, MD    Physical Exam: Vitals:   04/26/23 1900 04/26/23 2000 04/26/23 2105 04/26/23 2115  BP: (!) 144/70 134/76 136/73   Pulse: 80 76 83   Resp: (!) 26 (!) 26 18   Temp: 99.8 F (37.7 C)  98.6 F (37 C)   TempSrc: Oral  Oral   SpO2: 95% 92% 92% 99%   Constitutional: Resting in bed, frequent cough, otherwise NAD, calm, comfortable Eyes: EOMI, lids and conjunctivae normal ENMT: Mucous membranes are moist. Posterior pharynx clear of any exudate or lesions.Normal dentition.  Neck: normal, supple, no masses. Respiratory: clear to auscultation bilaterally, no wheezing, no crackles.  Frequent cough.  Normal respiratory effort. No accessory muscle use.  Cardiovascular: Regular rate and rhythm, no murmurs / rubs / gallops. No extremity edema. 2+ pedal pulses. Abdomen: no tenderness, no masses palpated. Musculoskeletal: no clubbing / cyanosis. No joint deformity upper and lower extremities. Good ROM, no contractures. Normal muscle tone.  Skin: no rashes, lesions, ulcers. No induration Neurologic:  Sensation intact. Strength 5/5 in all 4.  Psychiatric: Normal judgment and insight. Alert and oriented x 3. Normal mood.   EKG: Personally reviewed. Sinus rhythm, rate 82, RBBB.  Assessment/Plan Principal Problem:   Community acquired pneumonia of right lower lobe of lung   Camry Faler. is a 77 y.o. male with no known significant medical history who is admitted with right lower lobe pneumonia with associated bronchitis.  Assessment and Plan: * Community acquired pneumonia of right lower lobe of lung CT shows RLL PNA with associated bronchitis.  No evidence of PE.  Symptoms persisted with new fever despite recent course of antibiotics. -Continue IV ceftriaxone and azithromycin -Supplemental oxygen as  needed -Strep pneumonia and Legionella urinary antigens -COVID, flu, RSV negative -IS, FV, Mucinex  DVT prophylaxis: enoxaparin (LOVENOX) injection 40 mg Start: 04/26/23 2200 Code Status:   Code Status: Limited: Do not attempt resuscitation (DNR) -DNR-LIMITED -Do Not Intubate/DNI confirmed with patient on admission. Family Communication: Discussed with patient, he has discussed with family Disposition Plan: From home and likely discharge to home pending clinical progress Consults called: None Severity of Illness: The appropriate patient status for this patient is OBSERVATION. Observation status is judged to be reasonable and necessary in order to provide the required intensity of service to ensure the patient's safety. The patient's presenting symptoms, physical exam findings, and initial radiographic and laboratory data in the context of their medical condition is felt to place them at decreased risk for further clinical deterioration. Furthermore, it is anticipated that the patient will be medically stable for discharge from the hospital within 2 midnights of admission.   Darreld Mclean MD [facility]  If 7PM-7AM, please contact night-coverage [email or web address]  04/26/2023, 11:10 PM

## 2023-04-26 NOTE — Hospital Course (Signed)
George Dean. is a 77 y.o. male with no known significant medical history who is admitted with right lower lobe pneumonia with associated bronchitis.

## 2023-04-26 NOTE — Assessment & Plan Note (Signed)
CT shows RLL PNA with associated bronchitis.  No evidence of PE.  Symptoms persisted with new fever despite recent course of antibiotics. -Continue IV ceftriaxone and azithromycin -Supplemental oxygen as needed -Strep pneumonia and Legionella urinary antigens -COVID, flu, RSV negative -IS, FV, Mucinex

## 2023-04-27 DIAGNOSIS — J441 Chronic obstructive pulmonary disease with (acute) exacerbation: Secondary | ICD-10-CM | POA: Diagnosis present

## 2023-04-27 DIAGNOSIS — Z6372 Alcoholism and drug addiction in family: Secondary | ICD-10-CM | POA: Diagnosis not present

## 2023-04-27 DIAGNOSIS — J189 Pneumonia, unspecified organism: Secondary | ICD-10-CM | POA: Diagnosis present

## 2023-04-27 DIAGNOSIS — I4510 Unspecified right bundle-branch block: Secondary | ICD-10-CM | POA: Diagnosis present

## 2023-04-27 DIAGNOSIS — Z66 Do not resuscitate: Secondary | ICD-10-CM | POA: Diagnosis present

## 2023-04-27 DIAGNOSIS — Z8249 Family history of ischemic heart disease and other diseases of the circulatory system: Secondary | ICD-10-CM | POA: Diagnosis not present

## 2023-04-27 DIAGNOSIS — G4700 Insomnia, unspecified: Secondary | ICD-10-CM | POA: Diagnosis present

## 2023-04-27 DIAGNOSIS — F32A Depression, unspecified: Secondary | ICD-10-CM | POA: Diagnosis present

## 2023-04-27 DIAGNOSIS — E785 Hyperlipidemia, unspecified: Secondary | ICD-10-CM | POA: Diagnosis present

## 2023-04-27 DIAGNOSIS — Z9841 Cataract extraction status, right eye: Secondary | ICD-10-CM | POA: Diagnosis not present

## 2023-04-27 DIAGNOSIS — Z79899 Other long term (current) drug therapy: Secondary | ICD-10-CM | POA: Diagnosis not present

## 2023-04-27 DIAGNOSIS — N400 Enlarged prostate without lower urinary tract symptoms: Secondary | ICD-10-CM | POA: Diagnosis present

## 2023-04-27 DIAGNOSIS — J440 Chronic obstructive pulmonary disease with acute lower respiratory infection: Secondary | ICD-10-CM | POA: Diagnosis present

## 2023-04-27 DIAGNOSIS — Z87891 Personal history of nicotine dependence: Secondary | ICD-10-CM | POA: Diagnosis not present

## 2023-04-27 DIAGNOSIS — Z9842 Cataract extraction status, left eye: Secondary | ICD-10-CM | POA: Diagnosis not present

## 2023-04-27 DIAGNOSIS — Z1152 Encounter for screening for COVID-19: Secondary | ICD-10-CM | POA: Diagnosis not present

## 2023-04-27 DIAGNOSIS — Z811 Family history of alcohol abuse and dependence: Secondary | ICD-10-CM | POA: Diagnosis not present

## 2023-04-27 LAB — BASIC METABOLIC PANEL
Anion gap: 13 (ref 5–15)
BUN: 12 mg/dL (ref 8–23)
CO2: 23 mmol/L (ref 22–32)
Calcium: 9.7 mg/dL (ref 8.9–10.3)
Chloride: 104 mmol/L (ref 98–111)
Creatinine, Ser: 0.9 mg/dL (ref 0.61–1.24)
GFR, Estimated: >60 mL/min (ref >60)
Glucose, Bld: 118 mg/dL — ABNORMAL HIGH (ref 70–99)
Potassium: 3.8 mmol/L (ref 3.5–5.1)
Sodium: 140 mmol/L (ref 135–145)

## 2023-04-27 LAB — CBC
HCT: 41.9 % (ref 39.0–52.0)
Hemoglobin: 14.4 g/dL (ref 13.0–17.0)
MCH: 32 pg (ref 26.0–34.0)
MCHC: 34.4 g/dL (ref 30.0–36.0)
MCV: 93.1 fL (ref 80.0–100.0)
Platelets: 141 10*3/uL — ABNORMAL LOW (ref 150–400)
RBC: 4.5 MIL/uL (ref 4.22–5.81)
RDW: 13.1 % (ref 11.5–15.5)
WBC: 12.5 10*3/uL — ABNORMAL HIGH (ref 4.0–10.5)
nRBC: 0 % (ref 0.0–0.2)

## 2023-04-27 MED ORDER — INFLUENZA VAC A&B SURF ANT ADJ 0.5 ML IM SUSY
0.5000 mL | PREFILLED_SYRINGE | INTRAMUSCULAR | Status: DC
  Filled 2023-04-27: qty 0.5

## 2023-04-27 NOTE — Plan of Care (Signed)

## 2023-04-27 NOTE — Progress Notes (Signed)
Progress Note   Patient: George Paul. [id number] DOB: 26-Sep-1945 DOA: 04/26/2023     0 DOS: the patient was seen and examined on 04/27/2023   Brief hospital course: 77 y.o. with h/o BPH, depression, HLD, and insomnia who was admitted on 10/25 with right lower lobe pneumonia with associated bronchitis.  He was started on Ceftriaxone and Azithromycin.  Assessment and Plan:  Community acquired pneumonia of right lower lobe of lung Presented with SOB - Symptoms persisted with new fever despite recent course of antibiotics No hypoxia, documented O2 no lower than 92% Negative CXR CTA shows RLL PNA with associated chronic bronchitis (has h/o smoking) Continue IV ceftriaxone and azithromycin Supplemental oxygen as needed Strep pneumonia and Legionella urinary antigens pending COVID, flu, RSV negative IS, Flutter valve Mucinex ordered  Depression/Insomnia Continue Lunesta, mirtazepine  DNR Admitting physician discussed with patient and he would not desire CPR/intubation     Consultants: None  Procedures: None  Antibiotics: Ceftriaxone 10/25-29 Azithromycin 10/25-29      Subjective: He lives at home with son and daughter-In-law.  He feels a little better and has been up to the bathroom without significant difficulty. Continues to cough, ongoing need for O2.    Objective: Vitals:   04/27/23 0412 04/27/23 0805  BP: 129/68 (!) 128/59  Pulse: 65 74  Resp: 18 16  Temp: 98.6 F (37 C) 97.7 F (36.5 C)  SpO2: 96% 94%    Intake/Output Summary (Last 24 hours) at 04/27/2023 1522 Last data filed at 04/27/2023 8469 Gross per 24 hour  Intake 469.85 ml  Output --  Net 469.85 ml   Filed Weights   04/26/23 2106  Weight: 80.7 kg    Exam:  General:  Appears calm and comfortable and is in NAD Eyes:  EOMI, normal lids, iris ENT:  grossly normal hearing, lips & tongue, mmm Neck:  no LAD, masses or thyromegaly Cardiovascular:  RRR, no m/r/g. No LE edema.   Respiratory:   RLL rhonchi.  Normal respiratory effort.  On 2L [place] O2. Abdomen:  soft, NT, ND Skin:  no rash or induration seen on limited exam Musculoskeletal:  grossly normal tone BUE/BLE, good ROM, no bony abnormality Psychiatric:  blunted mood and affect, speech fluent and appropriate, AOx3 Neurologic:  CN 2-12 grossly intact, moves all extremities in coordinated fashion  Data Reviewed: I have reviewed the patient's lab results since admission.  Pertinent labs for today include:   Glucose 118 WBC 12.5 Platelets 141 COVID/flu/RSV negative    Family Communication: None present; I left a message for his son  Disposition: Status is: Inpatient Admit - It is my clinical opinion that admission to INPATIENT is reasonable and necessary because of the expectation that this patient will require hospital care that crosses at least 2 midnights to treat this condition based on the medical complexity of the problems presented.  Given the aforementioned information, the predictability of an adverse outcome is felt to be significant.      Time spent: 50 minutes  Unresulted Labs (From admission, onward)     Start     Ordered   04/28/23 0500  CBC with Differential/Platelet  Tomorrow morning,   R       Question:  Specimen collection method  Answer:  Lab=Lab collect   04/27/23 1522   04/28/23 0500  Basic metabolic panel  Tomorrow morning,   R       Question:  Specimen collection method  Answer:  Lab=Lab collect   04/27/23 1522  04/26/23 2129  Legionella Pneumophila Serogp 1 Ur Ag  (COPD / Pneumonia / Cellulitis / Lower Extremity Wound)  Once,   R        04/26/23 2129   04/26/23 2129  Strep pneumoniae urinary antigen  (COPD / Pneumonia / Cellulitis / Lower Extremity Wound)  Once,   R        04/26/23 2129             Author: Jonah Blue, MD 04/27/2023 3:22 PM  For on call review [email or web address].

## 2023-04-28 DIAGNOSIS — J441 Chronic obstructive pulmonary disease with (acute) exacerbation: Secondary | ICD-10-CM | POA: Diagnosis present

## 2023-04-28 DIAGNOSIS — J189 Pneumonia, unspecified organism: Secondary | ICD-10-CM | POA: Diagnosis not present

## 2023-04-28 LAB — CBC WITH DIFFERENTIAL/PLATELET
Abs Immature Granulocytes: 0.07 10*3/uL (ref 0.00–0.07)
Basophils Absolute: 0 10*3/uL (ref 0.0–0.1)
Basophils Relative: 0 %
Eosinophils Absolute: 0 10*3/uL (ref 0.0–0.5)
Eosinophils Relative: 0 %
HCT: 41.4 % (ref 39.0–52.0)
Hemoglobin: 13.6 g/dL (ref 13.0–17.0)
Immature Granulocytes: 1 %
Lymphocytes Relative: 10 %
Lymphs Abs: 1.2 10*3/uL (ref 0.7–4.0)
MCH: 31.2 pg (ref 26.0–34.0)
MCHC: 32.9 g/dL (ref 30.0–36.0)
MCV: 95 fL (ref 80.0–100.0)
Monocytes Absolute: 1 10*3/uL (ref 0.1–1.0)
Monocytes Relative: 8 %
Neutro Abs: 10.1 10*3/uL — ABNORMAL HIGH (ref 1.7–7.7)
Neutrophils Relative %: 81 %
Platelets: 145 10*3/uL — ABNORMAL LOW (ref 150–400)
RBC: 4.36 MIL/uL (ref 4.22–5.81)
RDW: 13.2 % (ref 11.5–15.5)
WBC: 12.4 10*3/uL — ABNORMAL HIGH (ref 4.0–10.5)
nRBC: 0 % (ref 0.0–0.2)

## 2023-04-28 LAB — BASIC METABOLIC PANEL
Anion gap: 10 (ref 5–15)
BUN: 14 mg/dL (ref 8–23)
CO2: 23 mmol/L (ref 22–32)
Calcium: 9.1 mg/dL (ref 8.9–10.3)
Chloride: 105 mmol/L (ref 98–111)
Creatinine, Ser: 0.83 mg/dL (ref 0.61–1.24)
GFR, Estimated: >60 mL/min (ref >60)
Glucose, Bld: 95 mg/dL (ref 70–99)
Potassium: 3.9 mmol/L (ref 3.5–5.1)
Sodium: 138 mmol/L (ref 135–145)

## 2023-04-28 MED ORDER — IPRATROPIUM-ALBUTEROL 0.5-2.5 (3) MG/3ML IN SOLN
3.0000 mL | Freq: Two times a day (BID) | RESPIRATORY_TRACT | Status: DC
  Administered 2023-04-28 – 2023-04-30 (×4): 3 mL via RESPIRATORY_TRACT
  Filled 2023-04-28 (×4): qty 3

## 2023-04-28 MED ORDER — AZITHROMYCIN 500 MG PO TABS
500.0000 mg | ORAL_TABLET | ORAL | Status: DC
  Administered 2023-04-28 – 2023-04-29 (×2): 500 mg via ORAL
  Filled 2023-04-28 (×2): qty 1

## 2023-04-28 MED ORDER — IPRATROPIUM-ALBUTEROL 0.5-2.5 (3) MG/3ML IN SOLN
3.0000 mL | Freq: Four times a day (QID) | RESPIRATORY_TRACT | Status: DC
  Administered 2023-04-28: 3 mL via RESPIRATORY_TRACT
  Filled 2023-04-28: qty 3

## 2023-04-28 MED ORDER — PREDNISONE 20 MG PO TABS
40.0000 mg | ORAL_TABLET | Freq: Every day | ORAL | Status: DC
  Administered 2023-04-28 – 2023-04-30 (×3): 40 mg via ORAL
  Filled 2023-04-28 (×3): qty 2

## 2023-04-28 MED ORDER — DEXTROMETHORPHAN POLISTIREX ER 30 MG/5ML PO SUER
30.0000 mg | Freq: Two times a day (BID) | ORAL | Status: DC | PRN
  Administered 2023-04-28 – 2023-04-29 (×3): 30 mg via ORAL
  Filled 2023-04-28 (×5): qty 5

## 2023-04-28 MED ORDER — ALBUTEROL SULFATE (2.5 MG/3ML) 0.083% IN NEBU: 2.5000 mg | INHALATION_SOLUTION | RESPIRATORY_TRACT | Status: DC | PRN

## 2023-04-28 NOTE — Progress Notes (Signed)
Progress Note   Patient: George Paul. [id number] DOB: 05-01-1946 DOA: 04/26/2023     1 DOS: the patient was seen and examined on 04/28/2023   Brief hospital course: 77 y.o. with h/o BPH, depression, HLD, and insomnia who was admitted on 10/25 with right lower lobe pneumonia with associated bronchitis.  He was started on Ceftriaxone and Azithromycin.  Assessment and Plan:  Community acquired pneumonia of right lower lobe of lung Presented with SOB - symptoms persisted with new fever despite recent course of antibiotics No hypoxia lower than 91% on RA documented Negative CXR CTA shows RLL PNA with associated chronic bronchitis (has h/o smoking) Continue IV ceftriaxone and azithromycin Supplemental oxygen as needed Strep pneumonia and Legionella urinary antigens pending COVID, flu, RSV negative IS, Flutter valve Mucinex ordered Will check procalcitonin in AM  Probable COPD Possible exacerbation contributing to his slower recovery Add Duonebs q6h and prn Albuterol q2h Add steroids - prednisone 40 mg daily x 5 days   Depression/Insomnia Continue Lunesta, mirtazepine   DNR Admitting physician discussed with patient and he would not desire CPR/intubation         Consultants: None   Procedures: None   Antibiotics: Ceftriaxone 10/25-29 Azithromycin 10/25-29  30 Day Unplanned Readmission Risk Score    Flowsheet Row ED to Hosp-Admission (Current) from 04/26/2023 in [place] [facility]  30 Day Unplanned Readmission Risk Score (%) 8.27 Filed at 04/28/2023 0401       This score is the patient's risk of an unplanned readmission within 30 days of being discharged (0 -100%). The score is based on dignosis, age, lab data, medications, orders, and past utilization.   Low:  0-14.9   Medium: 15-21.9   High: 22-29.9   Extreme: 30 and above           Subjective: Still not feeling significantly better.  Has ongoing cough, SOB with exertion.  No  fevers.   Objective: Vitals:   04/28/23 0807 04/28/23 0822  BP: 134/63   Pulse: 78   Resp: 20   Temp: 98.6 F (37 C)   SpO2: 94% 95%    Intake/Output Summary (Last 24 hours) at 04/28/2023 1421 Last data filed at 04/28/2023 1018 Gross per 24 hour  Intake 656 ml  Output --  Net 656 ml   Filed Weights   04/26/23 2106  Weight: 80.7 kg    Exam:  General:  Appears calm and comfortable and is in NAD Eyes:  EOMI, normal lids, iris ENT:  grossly normal hearing, lips & tongue, mmm Neck:  no LAD, masses or thyromegaly Cardiovascular:  RRR, no m/r/g. No LE edema.  Respiratory:   RLL rhonchi.  Normal respiratory effort.  On 2L [place] O2. Abdomen:  soft, NT, ND Skin:  no rash or induration seen on limited exam Musculoskeletal:  grossly normal tone BUE/BLE, good ROM, no bony abnormality Psychiatric:  blunted mood and affect, speech fluent and appropriate, AOx3 Neurologic:  CN 2-12 grossly intact, moves all extremities in coordinated fashion  Data Reviewed: I have reviewed the patient's lab results since admission.  Pertinent labs for today include:   Normal BMP WBC 12.4 Platelets 145     Family Communication: None present; I spoke with his son by telephone  Disposition: Status is: Inpatient Remains inpatient appropriate because: ongoing management     Time spent: 50 minutes  Unresulted Labs (From admission, onward)     Start     Ordered   04/29/23 0500  CBC  with Differential/Platelet  Tomorrow morning,   R       Question:  Specimen collection method  Answer:  Lab=Lab collect   04/28/23 1421   04/29/23 0500  Basic metabolic panel  Tomorrow morning,   R       Question:  Specimen collection method  Answer:  Lab=Lab collect   04/28/23 1421   04/29/23 0500  Procalcitonin  Tomorrow morning,   R       References:    Procalcitonin Lower Respiratory Tract Infection AND Sepsis Procalcitonin Algorithm  Question:  Specimen collection method  Answer:  Lab=Lab collect    04/28/23 1421   04/26/23 2129  Legionella Pneumophila Serogp 1 Ur Ag  (COPD / Pneumonia / Cellulitis / Lower Extremity Wound)  Once,   R        04/26/23 2129   04/26/23 2129  Strep pneumoniae urinary antigen  (COPD / Pneumonia / Cellulitis / Lower Extremity Wound)  Once,   R        04/26/23 2129             Author: Jonah Blue, MD 04/28/2023 2:21 PM  For on call review [email or web address].

## 2023-04-28 NOTE — Plan of Care (Signed)

## 2023-04-28 NOTE — Progress Notes (Signed)
PHARMACIST - PHYSICIAN COMMUNICATION DR:   Ophelia Charter CONCERNING: Antibiotic IV to Oral Route Change Policy  RECOMMENDATION: This patient is receiving azithromycin by the intravenous route.  Based on criteria approved by the Pharmacy and Therapeutics Committee, the antibiotic(s) is/are being converted to the equivalent oral dose form(s).   DESCRIPTION: These criteria include: Patient being treated for a respiratory tract infection, urinary tract infection, cellulitis or clostridium difficile associated diarrhea if on metronidazole The patient is not neutropenic and does not exhibit a GI malabsorption state The patient is eating (either orally or via tube) and/or has been taking other orally administered medications for a least 24 hours The patient is improving clinically and has a Tmax < 100.5  If you have questions about this conversion, please contact the Pharmacy Department  []   [phone] )  [facility] []   [phone] )  [facility] [x]   [phone] )  [facility] []   [phone] )  [facility] []   [phone] )  [facility]    Loralee Pacas, PharmD, BCPS 04/28/2023 10:37 AM

## 2023-04-29 DIAGNOSIS — J189 Pneumonia, unspecified organism: Secondary | ICD-10-CM | POA: Diagnosis not present

## 2023-04-29 LAB — PROCALCITONIN: Procalcitonin: <0.1 ng/mL

## 2023-04-29 LAB — CBC WITH DIFFERENTIAL/PLATELET
Abs Immature Granulocytes: 0.05 10*3/uL (ref 0.00–0.07)
Basophils Absolute: 0 10*3/uL (ref 0.0–0.1)
Basophils Relative: 0 %
Eosinophils Absolute: 0 10*3/uL (ref 0.0–0.5)
Eosinophils Relative: 0 %
HCT: 43 % (ref 39.0–52.0)
Hemoglobin: 14.4 g/dL (ref 13.0–17.0)
Immature Granulocytes: 0 %
Lymphocytes Relative: 15 %
Lymphs Abs: 1.7 10*3/uL (ref 0.7–4.0)
MCH: 31.2 pg (ref 26.0–34.0)
MCHC: 33.5 g/dL (ref 30.0–36.0)
MCV: 93.1 fL (ref 80.0–100.0)
Monocytes Absolute: 0.9 10*3/uL (ref 0.1–1.0)
Monocytes Relative: 8 %
Neutro Abs: 8.5 10*3/uL — ABNORMAL HIGH (ref 1.7–7.7)
Neutrophils Relative %: 77 %
Platelets: 145 10*3/uL — ABNORMAL LOW (ref 150–400)
RBC: 4.62 MIL/uL (ref 4.22–5.81)
RDW: 12.8 % (ref 11.5–15.5)
WBC: 11.2 10*3/uL — ABNORMAL HIGH (ref 4.0–10.5)
nRBC: 0 % (ref 0.0–0.2)

## 2023-04-29 LAB — BASIC METABOLIC PANEL
Anion gap: 12 (ref 5–15)
Anion gap: 7 (ref 5–15)
BUN: 11 mg/dL (ref 8–23)
BUN: 12 mg/dL (ref 8–23)
CO2: 22 mmol/L (ref 22–32)
CO2: 23 mmol/L (ref 22–32)
Calcium: 9.5 mg/dL (ref 8.9–10.3)
Calcium: 9.2 mg/dL (ref 8.9–10.3)
Chloride: 106 mmol/L (ref 98–111)
Chloride: 104 mmol/L (ref 98–111)
Creatinine, Ser: 0.82 mg/dL (ref 0.61–1.24)
Creatinine, Ser: 0.76 mg/dL (ref 0.61–1.24)
GFR, Estimated: >60 mL/min (ref >60)
GFR, Estimated: >60 mL/min (ref >60)
Glucose, Bld: 97 mg/dL (ref 70–99)
Glucose, Bld: 136 mg/dL — ABNORMAL HIGH (ref 70–99)
Potassium: 3.8 mmol/L (ref 3.5–5.1)
Potassium: 4.1 mmol/L (ref 3.5–5.1)
Sodium: 140 mmol/L (ref 135–145)
Sodium: 134 mmol/L — ABNORMAL LOW (ref 135–145)

## 2023-04-29 NOTE — Plan of Care (Signed)

## 2023-04-29 NOTE — Progress Notes (Addendum)
Mobility Specialist Progress Note:    04/29/23 1436  Mobility  Activity Ambulated with assistance in hallway  Level of Assistance Standby assist, set-up cues, supervision of patient - no hands on  Assistive Device Front wheel walker  Distance Ambulated (ft) 100 ft  Activity Response Tolerated well  Mobility Referral Yes  $Mobility charge 1 Mobility  Mobility Specialist Start Time (ACUTE ONLY) 1411  Mobility Specialist Stop Time (ACUTE ONLY) 1420  Mobility Specialist Time Calculation (min) (ACUTE ONLY) 9 min   Pt received lying in bed, agreeable to mobility session. Tolerated well, denies SOB. Took 1 standing rest break when SpO2 86% on 2L, recovered after breathing with pursed lips, SpO2 90% on 2L. Returned to room, all needs met.   George Paul Mobility Specialist Please contact via Special educational needs teacher or  Rehab office at [phone]

## 2023-04-29 NOTE — Progress Notes (Signed)
Progress Note   Patient: George Paul. [id number] DOB: 1945/08/25 DOA: 04/26/2023     2 DOS: the patient was seen and examined on 04/29/2023   Brief hospital course: 77 y.o. with h/o BPH, depression, HLD, and insomnia who was admitted on 10/25 with right lower lobe pneumonia with associated bronchitis.  He was started on Ceftriaxone and Azithromycin.  Assessment and Plan:  Community acquired pneumonia of right lower lobe of lung Presented with SOB - symptoms persisted with new fever despite recent course of antibiotics No hypoxia lower than 91% on RA documented, currently 94% on RA Negative CXR CTA shows RLL PNA with associated chronic bronchitis (has h/o smoking) Continue IV ceftriaxone and azithromycin - completes 5 days tomorrow so will dc after abx in AM Supplemental oxygen as needed COVID, flu, RSV negative IS, Flutter valve Mucinex ordered   Probable COPD Possible exacerbation contributing to his slower recovery Add Duonebs q6h and prn Albuterol q2h Add steroids - prednisone 40 mg daily x 5 days   Depression/Insomnia Continue Lunesta, mirtazepine   DNR Admitting physician discussed with patient and he would not desire CPR/intubation      (His wife died of PNA at [facility] 2 years ago)    Consultants: None   Procedures: None   Antibiotics: Ceftriaxone 10/25-29 Azithromycin 10/25-29   30 Day Unplanned Readmission Risk Score    Flowsheet Row ED to Hosp-Admission (Current) from 04/26/2023 in [place] [facility]  30 Day Unplanned Readmission Risk Score (%) 9.71 Filed at 04/29/2023 0401       This score is the patient's risk of an unplanned readmission within 30 days of being discharged (0 -100%). The score is based on dignosis, age, lab data, medications, orders, and past utilization.   Low:  0-14.9   Medium: 15-21.9   High: 22-29.9   Extreme: 30 and above           Subjective: He is finally starting to feel better.  Weaned off O2  this AM and tolerating for now.  Still feels mildly SOB and fatigued, but better today.   Objective: Vitals:   04/29/23 1138 04/29/23 1154  BP:  132/87  Pulse:  72  Resp:  (!) 22  Temp:  98.1 F (36.7 C)  SpO2: 92% 90%    Intake/Output Summary (Last 24 hours) at 04/29/2023 1505 Last data filed at 04/29/2023 6295 Gross per 24 hour  Intake 120 ml  Output --  Net 120 ml   Filed Weights   04/26/23 2106  Weight: 80.7 kg    Exam:  General:  Appears calm and comfortable and is in NAD Eyes:  EOMI, normal lids, iris ENT:  grossly normal hearing, lips & tongue, mmm Neck:  no LAD, masses or thyromegaly Cardiovascular:  RRR, no m/r/g. No LE edema.  Respiratory:   RLL rhonchi.  Normal respiratory effort.  On RA Abdomen:  soft, NT, ND Skin:  no rash or induration seen on limited exam Musculoskeletal:  grossly normal tone BUE/BLE, good ROM, no bony abnormality Psychiatric:  blunted mood and affect, speech fluent and appropriate, AOx3 Neurologic:  CN 2-12 grossly intact, moves all extremities in coordinated fashion  Data Reviewed: I have reviewed the patient's lab results since admission.  Pertinent labs for today include:   Normal BMP WBC 11.2, improving  Platelets 145, stable Procalcitonin <0.10   Family Communication: None present; I spoke with his son by telephone  Disposition: Status is: Inpatient Remains inpatient appropriate because: home tomorrow  Time spent: 50 minutes  Unresulted Labs (From admission, onward)     Start     Ordered   04/30/23 0500  CBC with Differential/Platelet  Tomorrow morning,   R       Question:  Specimen collection method  Answer:  Lab=Lab collect   04/29/23 1503   04/29/23 1504  Basic metabolic panel  Once,   R       Question:  Specimen collection method  Answer:  Lab=Lab collect   04/29/23 1503             Author: Jonah Blue, MD 04/29/2023 3:05 PM  For on call review [email or web address].

## 2023-04-30 ENCOUNTER — Other Ambulatory Visit (HOSPITAL_COMMUNITY): Payer: Self-pay

## 2023-04-30 DIAGNOSIS — J189 Pneumonia, unspecified organism: Secondary | ICD-10-CM | POA: Diagnosis not present

## 2023-04-30 LAB — CBC WITH DIFFERENTIAL/PLATELET
Abs Immature Granulocytes: 0.06 10*3/uL (ref 0.00–0.07)
Basophils Absolute: 0 10*3/uL (ref 0.0–0.1)
Basophils Relative: 0 %
Eosinophils Absolute: 0.1 10*3/uL (ref 0.0–0.5)
Eosinophils Relative: 1 %
HCT: 42.5 % (ref 39.0–52.0)
Hemoglobin: 14.3 g/dL (ref 13.0–17.0)
Immature Granulocytes: 1 %
Lymphocytes Relative: 23 %
Lymphs Abs: 1.8 10*3/uL (ref 0.7–4.0)
MCH: 31.5 pg (ref 26.0–34.0)
MCHC: 33.6 g/dL (ref 30.0–36.0)
MCV: 93.6 fL (ref 80.0–100.0)
Monocytes Absolute: 0.7 10*3/uL (ref 0.1–1.0)
Monocytes Relative: 8 %
Neutro Abs: 5.4 10*3/uL (ref 1.7–7.7)
Neutrophils Relative %: 67 %
Platelets: 146 10*3/uL — ABNORMAL LOW (ref 150–400)
RBC: 4.54 MIL/uL (ref 4.22–5.81)
RDW: 12.8 % (ref 11.5–15.5)
WBC: 8 10*3/uL (ref 4.0–10.5)
nRBC: 0 % (ref 0.0–0.2)

## 2023-04-30 MED ORDER — ALBUTEROL SULFATE HFA 108 (90 BASE) MCG/ACT IN AERS
2.0000 | INHALATION_SPRAY | Freq: Four times a day (QID) | RESPIRATORY_TRACT | 2 refills | Status: DC | PRN
Start: 2023-04-30 — End: 2023-04-30

## 2023-04-30 MED ORDER — IPRATROPIUM-ALBUTEROL 20-100 MCG/ACT IN AERS
1.0000 | INHALATION_SPRAY | Freq: Four times a day (QID) | RESPIRATORY_TRACT | 1 refills | Status: AC
  Filled 2023-04-30: qty 4, 30d supply, fill #0

## 2023-04-30 MED ORDER — PREDNISONE 20 MG PO TABS
40.0000 mg | ORAL_TABLET | Freq: Every day | ORAL | 0 refills | Status: AC
  Filled 2023-04-30: qty 4, 2d supply, fill #0

## 2023-04-30 MED ORDER — GUAIFENESIN ER 600 MG PO TB12: 600.0000 mg | ORAL_TABLET | Freq: Two times a day (BID) | ORAL | 0 refills | Status: DC

## 2023-04-30 MED ORDER — IPRATROPIUM-ALBUTEROL 20-100 MCG/ACT IN AERS: 1.0000 | INHALATION_SPRAY | Freq: Four times a day (QID) | RESPIRATORY_TRACT | 1 refills | Status: DC

## 2023-04-30 MED ORDER — PREDNISONE 20 MG PO TABS: 40.0000 mg | ORAL_TABLET | Freq: Every day | ORAL | 0 refills | Status: DC

## 2023-04-30 MED ORDER — ALBUTEROL SULFATE HFA 108 (90 BASE) MCG/ACT IN AERS
2.0000 | INHALATION_SPRAY | Freq: Four times a day (QID) | RESPIRATORY_TRACT | 2 refills | Status: AC | PRN
  Filled 2023-04-30: qty 18, 25d supply, fill #0

## 2023-04-30 MED ORDER — DEXTROMETHORPHAN POLISTIREX ER 30 MG/5ML PO SUER: 30.0000 mg | Freq: Two times a day (BID) | ORAL | 0 refills | Status: DC | PRN

## 2023-04-30 MED ORDER — GUAIFENESIN ER 600 MG PO TB12
600.0000 mg | ORAL_TABLET | Freq: Two times a day (BID) | ORAL | 0 refills | Status: AC
  Filled 2023-04-30: qty 30, 15d supply, fill #0

## 2023-04-30 MED ORDER — DEXTROMETHORPHAN POLISTIREX ER 30 MG/5ML PO SUER
30.0000 mg | Freq: Two times a day (BID) | ORAL | 0 refills | Status: AC | PRN
  Filled 2023-04-30: qty 89, 9d supply, fill #0

## 2023-04-30 NOTE — Progress Notes (Signed)
Mobility Specialist Progress Note:   04/30/23 0956  Mobility  Activity Ambulated independently in hallway  Level of Assistance Independent after set-up  Assistive Device None  Distance Ambulated (ft) 100 ft  Activity Response Tolerated well  Mobility Referral Yes  $Mobility charge 1 Mobility  Mobility Specialist Start Time (ACUTE ONLY) 0956  Mobility Specialist Stop Time (ACUTE ONLY) 1000  Mobility Specialist Time Calculation (min) (ACUTE ONLY) 4 min   Pt received in bed, agreeable to mobility session. Tolerated well, asx throughout. SpO2 91% on 1L during and after session. Returned pt to room, all needs met.   Feliciana Rossetti Mobility Specialist Please contact via Special educational needs teacher or  Rehab office at [phone]

## 2023-04-30 NOTE — Plan of Care (Signed)
  Problem: Education: Goal: Knowledge of General Education information will improve Description: Including pain rating scale, medication(s)/side effects and non-pharmacologic comfort measures Outcome: Adequate for Discharge   Problem: Health Behavior/Discharge Planning: Goal: Ability to manage health-related needs will improve Outcome: Adequate for Discharge   Problem: Clinical Measurements: Goal: Ability to maintain clinical measurements within normal limits will improve Outcome: Adequate for Discharge Goal: Will remain free from infection Outcome: Adequate for Discharge Goal: Diagnostic test results will improve Outcome: Adequate for Discharge Goal: Respiratory complications will improve Outcome: Adequate for Discharge Goal: Cardiovascular complication will be avoided Outcome: Adequate for Discharge   Problem: Activity: Goal: Risk for activity intolerance will decrease Outcome: Adequate for Discharge   Problem: Nutrition: Goal: Adequate nutrition will be maintained Outcome: Adequate for Discharge   Problem: Coping: Goal: Level of anxiety will decrease Outcome: Adequate for Discharge   Problem: Elimination: Goal: Will not experience complications related to bowel motility Outcome: Adequate for Discharge Goal: Will not experience complications related to urinary retention Outcome: Adequate for Discharge   Problem: Pain Management: Goal: General experience of comfort will improve Outcome: Adequate for Discharge   Problem: Safety: Goal: Ability to remain free from injury will improve Outcome: Adequate for Discharge   Problem: Skin Integrity: Goal: Risk for impaired skin integrity will decrease Outcome: Adequate for Discharge   Problem: Activity: Goal: Ability to tolerate increased activity will improve Outcome: Adequate for Discharge   Problem: Clinical Measurements: Goal: Ability to maintain a body temperature in the normal range will improve Outcome: Adequate  for Discharge   Problem: Respiratory: Goal: Ability to maintain adequate ventilation will improve Outcome: Adequate for Discharge Goal: Ability to maintain a clear airway will improve Outcome: Adequate for Discharge

## 2023-04-30 NOTE — Plan of Care (Signed)

## 2023-04-30 NOTE — Discharge Summary (Signed)
Physician Discharge Summary   Patient: George Paul. MRN: [id number] DOB: 09-Sep-1945  Admit date:     04/26/2023  Discharge date: 04/30/23  Discharge Physician: Jonah Blue   PCP: Tally Joe, MD   Recommendations at discharge:   Complete prednisone for 2 more days Follow up with Dr. Azucena Cecil in 1-2 weeks, sooner if symptoms are not improving You may need pulmonology follow up; discuss with Dr. Azucena Cecil  Discharge Diagnoses: Principal Problem:   Community acquired pneumonia of right lower lobe of lung Active Problems:   Depression   Insomnia   DNR (do not resuscitate)   COPD with acute exacerbation [facility])   Hospital Course: 77 y.o. with h/o BPH, depression, HLD, and insomnia who was admitted on 10/25 with right lower lobe pneumonia with associated bronchitis.  He was started on Ceftriaxone and Azithromycin.  Assessment and Plan:  Community acquired pneumonia of right lower lobe of lung Presented with SOB - symptoms persisted with new fever despite recent course of antibiotics No hypoxia lower than 90% on RA documented throughout hospitalization Negative CXR CTA shows RLL PNA with associated chronic bronchitis (has h/o smoking) Continue IV ceftriaxone and azithromycin - completes 5 days today Supplemental oxygen as needed COVID, flu, RSV negative IS, Flutter valve Mucinex ordered   Probable COPD Possible exacerbation contributing to his slower recovery Combivent q6h and prn Albuterol q2h ordered for home Added steroids - prednisone 40 mg daily x 5 days   Depression/Insomnia Continue Lunesta, mirtazepine   DNR Admitting physician discussed with patient and he would not desire CPR/intubation       (His wife died of PNA at [facility] 2 years ago)    Consultants: None   Procedures: None   Antibiotics: Ceftriaxone 10/25-29 Azithromycin 10/25-29     30 Day Unplanned Readmission Risk Score    Flowsheet Row ED to Hosp-Admission (Current) from 04/26/2023  in [place] [facility]  30 Day Unplanned Readmission Risk Score (%) 9.89 Filed at 04/30/2023 0801       This score is the patient's risk of an unplanned readmission within 30 days of being discharged (0 -100%). The score is based on dignosis, age, lab data, medications, orders, and past utilization.   Low:  0-14.9   Medium: 15-21.9   High: 22-29.9   Extreme: 30 and above          Pain control - [place] Controlled Substance Reporting System database was reviewed. and patient was instructed, not to drive, operate heavy machinery, perform activities at heights, swimming or participation in water activities or provide baby-sitting services while on Pain, Sleep and Anxiety Medications; until their outpatient Physician has advised to do so again. Also recommended to not to take more than prescribed Pain, Sleep and Anxiety Medications.    Disposition: Home Diet recommendation:  Regular diet DISCHARGE MEDICATION: Allergies as of 04/30/2023   No Known Allergies      Medication List     STOP taking these medications    doxycycline 100 MG capsule Commonly known as: VIBRAMYCIN       TAKE these medications    AIRBORNE PO Take 1 tablet by mouth in the morning.   albuterol 108 (90 Base) MCG/ACT inhaler Commonly known as: VENTOLIN HFA Inhale 2 puffs into the lungs every 6 (six) hours as needed for wheezing or shortness of breath.   dextromethorphan 30 MG/5ML liquid Commonly known as: DELSYM Take 5 mLs (30 mg total) by mouth 2 (two) times daily as needed  for cough.   Eszopiclone 3 MG Tabs Take 3 mg by mouth at bedtime. Take immediately before bedtime   guaiFENesin 600 MG 12 hr tablet Commonly known as: MUCINEX Take 1 tablet (600 mg total) by mouth 2 (two) times daily.   Ipratropium-Albuterol 20-100 MCG/ACT Aers respimat Commonly known as: COMBIVENT Inhale 1 puff into the lungs every 6 (six) hours.   mirtazapine 15 MG tablet Commonly known as:  REMERON Take 15 mg by mouth at bedtime.   Omega-3 1000 MG Caps Take 1 capsule by mouth in the morning and at bedtime.   predniSONE 20 MG tablet Commonly known as: DELTASONE Take 2 tablets (40 mg total) by mouth daily with breakfast for 2 days. Start taking on: May 01, 2023   psyllium 95 % Pack Commonly known as: HYDROCIL/METAMUCIL Take 1 packet by mouth daily.   VITAMIN D-3 PO Take 1 tablet by mouth in the morning and at bedtime. Unknown strength        Discharge Exam:   Subjective: Continues to feel better.  No significant SOB even with ambulation.  90% on room air.  Overall improved and feels comfortable with plan for discharge.   Objective: Vitals:   04/30/23 0744 04/30/23 0857  BP:    Pulse: 74   Resp: 18   Temp:    SpO2: 95% 90%   No intake or output data in the 24 hours ending 04/30/23 1307  Filed Weights   04/26/23 2106  Weight: 80.7 kg    Exam:  General:  Appears calm and comfortable and is in NAD Eyes:  EOMI, normal lids, iris ENT:  grossly normal hearing, lips & tongue, mmm Neck:  no LAD, masses or thyromegaly Cardiovascular:  RRR, no m/r/g. No LE edema.  Respiratory:   CTAB.  Normal respiratory effort.  Changed to RA during evaluation without difficulty. Abdomen:  soft, NT, ND Skin:  no rash or induration seen on limited exam Musculoskeletal:  grossly normal tone BUE/BLE, good ROM, no bony abnormality Psychiatric:  blunted mood and affect, speech fluent and appropriate, AOx3 Neurologic:  CN 2-12 grossly intact, moves all extremities in coordinated fashion  Data Reviewed: I have reviewed the patient's lab results since admission.  Pertinent labs for today include:   Stable CBC    Condition at discharge: improving  The results of significant diagnostics from this hospitalization (including imaging, microbiology, ancillary and laboratory) are listed below for reference.   Imaging Studies: CT Angio Chest PE W and/or Wo  Contrast  Result Date: 04/26/2023 CLINICAL DATA:  Pulmonary embolism.  High probability EXAM: CT ANGIOGRAPHY CHEST WITH CONTRAST TECHNIQUE: Multidetector CT imaging of the chest was performed using the standard protocol during bolus administration of intravenous contrast. Multiplanar CT image reconstructions and MIPs were obtained to evaluate the vascular anatomy. RADIATION DOSE REDUCTION: This exam was performed according to the departmental dose-optimization program which includes automated exposure control, adjustment of the mA and/or kV according to patient size and/or use of iterative reconstruction technique. CONTRAST:  80mL OMNIPAQUE IOHEXOL 350 MG/ML SOLN COMPARISON:  No filling defects within the pulmonary arteries to suggest acute pulmonary embolism. FINDINGS: Cardiovascular: No filling defects within the pulmonary arteries to suggest acute pulmonary embolism. Mediastinum/Nodes: No axillary or supraclavicular adenopathy. No mediastinal or hilar adenopathy. No pericardial fluid. Esophagus normal. Lungs/Pleura: Segmental peribronchial thickening in the RIGHT lower lobe. Findings similar to but progressive from CT 06/23/2019. A bronchial narrowing at the level of the RIGHT inferior hilum (image 78/6) Nodule in the azygoesophageal recess  measuring 6 mm (84/6) is unchanged. Upper Abdomen: Small hypodense lesions in the liver unchanged from comparison CT exam in 2020. Adrenal glands normal. Musculoskeletal: No aggressive osseous lesion. Review of the MIP images confirms the above findings. IMPRESSION: 1. No evidence acute pulmonary embolism. 2. Segmental peribronchial thickening in the RIGHT lower lobe with peribronchial narrowing at the level of the hilum. Findings are progressive from 2020. Findings are favored chronic bronchitis or aspiration pneumonitis. Potential super superinfection in the RIGHT lower lobe. Consider pulmonary consultation for chronic lung findings. 3. Stable nodule in the  azygoesophageal recess. 4. Stable hypodense lesions in the liver favored benign. Electronically Signed   By: Genevive Bi M.D.   On: 04/26/2023 18:45   DG Chest 2 View  Result Date: 04/26/2023 CLINICAL DATA:  Cough. EXAM: CHEST - 2 VIEW COMPARISON:  07/06/2022. FINDINGS: Bilateral lung fields are clear. There is elevation of right hemidiaphragm. Bilateral costophrenic angles are clear. Normal cardio-mediastinal silhouette. No acute osseous abnormalities. The soft tissues are within normal limits. IMPRESSION: *No active cardiopulmonary disease. Electronically Signed   By: Jules Schick M.D.   On: 04/26/2023 12:56    Microbiology: Results for orders placed or performed during the hospital encounter of 04/26/23  Resp panel by RT-PCR (RSV, Flu A&B, Covid) Anterior Nasal Swab     Status: None   Collection Time: 04/26/23 11:25 AM   Specimen: Anterior Nasal Swab  Result Value Ref Range Status   SARS Coronavirus 2 by RT PCR NEGATIVE NEGATIVE Final    Comment: (NOTE) SARS-CoV-2 target nucleic acids are NOT DETECTED.  The SARS-CoV-2 RNA is generally detectable in upper respiratory specimens during the acute phase of infection. The lowest concentration of SARS-CoV-2 viral copies this assay can detect is 138 copies/mL. A negative result does not preclude SARS-Cov-2 infection and should not be used as the sole basis for treatment or other patient management decisions. A negative result may occur with  improper specimen collection/handling, submission of specimen other than nasopharyngeal swab, presence of viral mutation(s) within the areas targeted by this assay, and inadequate number of viral copies(<138 copies/mL). A negative result must be combined with clinical observations, patient history, and epidemiological information. The expected result is Negative.  Fact Sheet for Patients:  BloggerCourse.com  Fact Sheet for Healthcare Providers:   SeriousBroker.it  This test is no t yet approved or cleared by the [place] FDA and  has been authorized for detection and/or diagnosis of SARS-CoV-2 by FDA under an Emergency Use Authorization (EUA). This EUA will remain  in effect (meaning this test can be used) for the duration of the COVID-19 declaration under Section 564(b)(1) of the Act, 21 U.S.C.section 360bbb-3(b)(1), unless the authorization is terminated  or revoked sooner.       Influenza A by PCR NEGATIVE NEGATIVE Final   Influenza B by PCR NEGATIVE NEGATIVE Final    Comment: (NOTE) The Xpert Xpress SARS-CoV-2/FLU/RSV plus assay is intended as an aid in the diagnosis of influenza from Nasopharyngeal swab specimens and should not be used as a sole basis for treatment. Nasal washings and aspirates are unacceptable for Xpert Xpress SARS-CoV-2/FLU/RSV testing.  Fact Sheet for Patients: BloggerCourse.com  Fact Sheet for Healthcare Providers: SeriousBroker.it  This test is not yet approved or cleared by the [place] FDA and has been authorized for detection and/or diagnosis of SARS-CoV-2 by FDA under an Emergency Use Authorization (EUA). This EUA will remain in effect (meaning this test can be used) for the duration of the COVID-19 declaration  under Section 564(b)(1) of the Act, 21 U.S.C. section 360bbb-3(b)(1), unless the authorization is terminated or revoked.     Resp Syncytial Virus by PCR NEGATIVE NEGATIVE Final    Comment: (NOTE) Fact Sheet for Patients: BloggerCourse.com  Fact Sheet for Healthcare Providers: SeriousBroker.it  This test is not yet approved or cleared by the [place] FDA and has been authorized for detection and/or diagnosis of SARS-CoV-2 by FDA under an Emergency Use Authorization (EUA). This EUA will remain in effect (meaning this test can be used) for  the duration of the COVID-19 declaration under Section 564(b)(1) of the Act, 21 U.S.C. section 360bbb-3(b)(1), unless the authorization is terminated or revoked.  Performed at [facility], [address] [place], [place] [address]     Labs: CBC: Recent Labs  Lab 04/26/23 1413 04/27/23 0434 04/28/23 9562 04/29/23 0638 04/30/23 0609  WBC 14.9* 12.5* 12.4* 11.2* 8.0  NEUTROABS 12.9*  --  10.1* 8.5* 5.4  HGB 15.4 14.4 13.6 14.4 14.3  HCT 45.8 41.9 41.4 43.0 42.5  MCV 93.3 93.1 95.0 93.1 93.6  PLT 154 141* 145* 145* 146*   Basic Metabolic Panel: Recent Labs  Lab 04/26/23 1413 04/27/23 0434 04/28/23 0633 04/29/23 0638 04/29/23 1551  NA 139 140 138 140 134*  K 3.7 3.8 3.9 3.8 4.1  CL 103 104 105 106 104  CO2 26 23 23 22 23   GLUCOSE 103* 118* 95 97 136*  BUN 11 12 14 11 12   CREATININE 0.88 0.90 0.83 0.82 0.76  CALCIUM 10.4* 9.7 9.1 9.5 9.2   Liver Function Tests: Recent Labs  Lab 04/26/23 1413  AST 15  ALT 9  ALKPHOS 83  BILITOT 0.7  PROT 7.9  ALBUMIN 4.8   CBG: No results for input(s): "GLUCAP" in the last 168 hours.  Discharge time spent: greater than 30 minutes.  Signed: Jonah Blue, MD [facility] 04/30/2023

## 2023-04-30 NOTE — Care Management Important Message (Signed)
Important Message  Patient Details  Name: George Paul. MRN: [id number] Date of Birth: Aug 21, 1945   Important Message Given:  Yes - Medicare IM     Dorena Bodo 04/30/2023, 2:14 PM

## 2023-06-18 ENCOUNTER — Encounter (INDEPENDENT_AMBULATORY_CARE_PROVIDER_SITE_OTHER): Payer: Federal, State, Local not specified - PPO | Admitting: Ophthalmology

## 2023-06-18 DIAGNOSIS — H353131 Nonexudative age-related macular degeneration, bilateral, early dry stage: Secondary | ICD-10-CM | POA: Diagnosis not present

## 2023-06-18 DIAGNOSIS — H43813 Vitreous degeneration, bilateral: Secondary | ICD-10-CM

## 2023-07-29 ENCOUNTER — Institutional Professional Consult (permissible substitution): Payer: Federal, State, Local not specified - PPO | Admitting: Pulmonary Disease

## 2023-08-22 ENCOUNTER — Encounter: Payer: Self-pay | Admitting: Pulmonary Disease

## 2023-08-22 ENCOUNTER — Ambulatory Visit: Payer: Federal, State, Local not specified - PPO | Admitting: Pulmonary Disease

## 2023-08-22 VITALS — BP 116/60 | HR 70 | Ht 71.0 in | Wt 190.2 lb

## 2023-08-22 DIAGNOSIS — J441 Chronic obstructive pulmonary disease with (acute) exacerbation: Secondary | ICD-10-CM

## 2023-08-22 DIAGNOSIS — J189 Pneumonia, unspecified organism: Secondary | ICD-10-CM | POA: Diagnosis not present

## 2023-08-22 NOTE — Progress Notes (Signed)
 George Paul    [id number]    [birth year]  Primary Care Physician:Swayne, Onalee Hua, MD  Referring Physician: Tally Joe, MD [phone] [address] [place],  [place] [address]  Chief complaint: Consult for recurrent pneumonia  HPI: 78 y.o. who  has a past medical history of Cataract, Depression, Inguinal hernia, and Umbilical hernia.   Discussed the use of AI scribe software for clinical note transcription with the patient, who gave verbal consent to proceed.  George Paul. is a 78 year old male who presents with recurrent pneumonia. He was referred by Dr. Erling Conte for further evaluation of his recurrent pneumonia.  He has experienced recurrent episodes of pneumonia, with hospitalizations in 01/03/2021and October 2024. He also had pneumonia in February 03, 2024concurrent with the flu, but was not hospitalized at that time. During his most recent hospitalization in October 2024, he was treated with IV ceftriaxone and azithromycin. COVID-19 and flu tests were negative during this episode.  He has persistent cold-like symptoms but feels okay currently. He has not used inhalers since recovering from the pneumonia in October 2024, although he has albuterol available. No known exposure to mold, hot tubs, Jacuzzis, feather pillows, or comforters at home. No recent travel and no family history of lung disease.  He quit smoking in 1975 after smoking for about 10 years, approximately two packs a day. 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 He stopped working after his wife became ill in 03-Jan-2023and passed away in 08/04/2021. He now spends most of his time with his grandchildren, aged 72 and eleven.   Pets: No pets Occupation: Retired Paramedic Exposures: No mold, hot tub, Financial controller.  No feather pillows or comforters Smoking history: 20-pack-year smoker.  Quit in 1975 Travel history: No significant travel history Relevant  family history: Family history of lung disease   Outpatient Encounter Medications as of 08/22/2023  Medication Sig   Eszopiclone 3 MG TABS Take 3 mg by mouth at bedtime. Take immediately before bedtime   mirtazapine (REMERON) 15 MG tablet Take 15 mg by mouth at bedtime.   Multiple Vitamins-Minerals (AIRBORNE PO) Take 1 tablet by mouth in the morning.   psyllium (HYDROCIL/METAMUCIL) 95 % PACK Take 1 packet by mouth daily.   albuterol (VENTOLIN HFA) 108 (90 Base) MCG/ACT inhaler Inhale 2 puffs into the lungs every 6 (six) hours as needed for wheezing or shortness of breath.   Cholecalciferol (VITAMIN D-3 PO) Take 1 tablet by mouth in the morning and at bedtime. Unknown strength   dextromethorphan (DELSYM) 30 MG/5ML liquid Take 5 mLs (30 mg total) by mouth 2 (two) times daily as needed for cough.   guaiFENesin (MUCINEX) 600 MG 12 hr tablet Take 1 tablet (600 mg total) by mouth 2 (two) times daily.   Ipratropium-Albuterol (COMBIVENT) 20-100 MCG/ACT AERS respimat Inhale 1 puff into the lungs every 6 (six) hours.   Omega-3 1000 MG CAPS Take 1 capsule by mouth in the morning and at bedtime.   No facility-administered encounter medications on file as of 08/22/2023.    Physical Exam: Blood pressure 116/60, pulse 70, height 5\' 11"  (1.803 m), weight 190 lb 3.2 oz (86.3 kg), SpO2 98%. Gen:      No acute distress HEENT:  EOMI, sclera anicteric Neck:     No masses; no thyromegaly Lungs:    Clear to auscultation bilaterally; normal respiratory effort CV:  Regular rate and rhythm; no murmurs Abd:      + bowel sounds; soft, non-tender; no palpable masses, no distension Ext:    No edema; adequate peripheral perfusion Skin:      Warm and dry; no rash Neuro: alert and oriented x 3 Psych: normal mood and affect  Data Reviewed: Imaging: CT chest 06/23/2019-space disease in the right lower lobe with groundglass opacities, peribronchovascular thickening, right hilar, subcarinal adenopathy  CT chest  04/26/2023-peribronchial thickening, airspace disease worsening compared to 2020. I reviewed the images personally  PFTs:  Labs:  Assessment and Plan Recurrent Pneumonia Recurrent pneumonia with episodes in January 2024, October 2024, and December 2020. Hospitalized for October 2024 and December 2020 episodes. Treated with IV ceftriaxone and azithromycin. Negative for COVID-19 and flu during October 2024 episode. Possible underlying anatomical abnormality or aspiration contributing to recurrence. No known episodes of aspiration or significant environmental exposures. Discussed need for chest CT to evaluate for underlying abnormalities such as a polyp or blockage. If abnormalities are found, a bronchoscopy may be necessary.  - Order chest CT to evaluate for underlying abnormalities - Consider bronchoscopy if CT scan reveals abnormalities  Possible Chronic Obstructive Pulmonary Disease (COPD) Possible COPD suggested by recurrent pneumonia and smoking history (10 years, quit in 1975). No formal diagnosis of COPD. No current use of inhalers. Lung function test needed to rule out COPD. Discussed plan to schedule lung function test in 3 months. - Schedule lung function test in 3 months to evaluate for COPD  Follow-up - Schedule follow-up appointment in 3 months - Coordinate scheduling of chest CT at radiology center.   Recommendations: CT chest PFTs and follow-up in clinic  Chilton Greathouse MD [facility] 08/22/2023, 1:43 PM  CC: Tally Joe, MD

## 2023-08-22 NOTE — Patient Instructions (Signed)
 VISIT SUMMARY:  Today, we discussed your recurrent pneumonia and possible underlying causes. We reviewed your history of pneumonia and smoking, and we talked about the next steps to identify any potential issues.  YOUR PLAN:  -RECURRENT PNEUMONIA: Recurrent pneumonia means having multiple episodes of lung infection over time. We will order a chest CT scan to check for any underlying issues like a blockage or polyp. If the CT scan shows any abnormalities, we may need to perform a bronchoscopy, which is a procedure to look inside your airways.  -POSSIBLE CHRONIC OBSTRUCTIVE PULMONARY DISEASE (COPD): COPD is a chronic lung disease that can cause breathing problems and is often related to smoking. Given your history of smoking and recurrent pneumonia, we will schedule a lung function test in 3 months to see if you have COPD.  INSTRUCTIONS:  Please schedule a follow-up appointment in 3 months. We will also coordinate the scheduling of your chest CT scan at the radiology center.

## 2023-08-27 ENCOUNTER — Ambulatory Visit
Admission: RE | Admit: 2023-08-27 | Discharge: 2023-08-27 | Disposition: A | Discharge status: Home / Self Care | Payer: Federal, State, Local not specified - PPO | Source: Ambulatory Visit | Attending: Pulmonary Disease

## 2023-08-27 DIAGNOSIS — J441 Chronic obstructive pulmonary disease with (acute) exacerbation: Secondary | ICD-10-CM

## 2023-08-27 DIAGNOSIS — J189 Pneumonia, unspecified organism: Secondary | ICD-10-CM

## 2023-10-10 ENCOUNTER — Encounter: Payer: Self-pay | Admitting: Pulmonary Disease

## 2023-11-22 ENCOUNTER — Encounter: Payer: Self-pay | Admitting: Pulmonary Disease

## 2023-11-22 ENCOUNTER — Ambulatory Visit: Payer: Federal, State, Local not specified - PPO | Admitting: Pulmonary Disease

## 2023-11-22 ENCOUNTER — Ambulatory Visit (INDEPENDENT_AMBULATORY_CARE_PROVIDER_SITE_OTHER): Payer: Federal, State, Local not specified - PPO | Admitting: Pulmonary Disease

## 2023-11-22 VITALS — BP 133/63 | HR 68 | Ht 71.0 in | Wt 190.2 lb

## 2023-11-22 DIAGNOSIS — J189 Pneumonia, unspecified organism: Secondary | ICD-10-CM

## 2023-11-22 DIAGNOSIS — J441 Chronic obstructive pulmonary disease with (acute) exacerbation: Secondary | ICD-10-CM

## 2023-11-22 LAB — PULMONARY FUNCTION TEST
DL/VA % pred: 105 %
DL/VA: 4.14 ml/min/mmHg/L
DLCO unc % pred: 95 %
DLCO unc: 24.54 ml/min/mmHg
FEF 25-75 Post: 3.3 L/s
FEF 25-75 Pre: 2.65 L/s
FEF2575-%Change-Post: 24 %
FEF2575-%Pred-Post: 145 %
FEF2575-%Pred-Pre: 116 %
FEV1-%Change-Post: 7 %
FEV1-%Pred-Post: 96 %
FEV1-%Pred-Pre: 90 %
FEV1-Post: 3.05 L
FEV1-Pre: 2.84 L
FEV1FVC-%Change-Post: -9 %
FEV1FVC-%Pred-Pre: 108 %
FEV6-%Change-Post: 19 %
FEV6-%Pred-Post: 104 %
FEV6-%Pred-Pre: 87 %
FEV6-Post: 4.27 L
FEV6-Pre: 3.57 L
FEV6FVC-%Change-Post: 0 %
FEV6FVC-%Pred-Post: 106 %
FEV6FVC-%Pred-Pre: 105 %
FVC-%Change-Post: 18 %
FVC-%Pred-Post: 98 %
FVC-%Pred-Pre: 82 %
FVC-Post: 4.29 L
FVC-Pre: 3.61 L
Post FEV1/FVC ratio: 71 %
Post FEV6/FVC ratio: 100 %
Pre FEV1/FVC ratio: 79 %
Pre FEV6/FVC Ratio: 99 %
RV % pred: 117 %
RV: 3.09 L
TLC % pred: 99 %
TLC: 7.2 L

## 2023-11-22 NOTE — Progress Notes (Signed)
 Full pft performed today.

## 2023-11-22 NOTE — Patient Instructions (Signed)
 Full pft performed today.

## 2023-11-22 NOTE — Progress Notes (Signed)
 George Paul    [id number]    [birth year]  Primary Care Physician:Swayne, Myrtie Atkinson, MD  Referring Physician: Rae Bugler, MD [phone] [address] [place],  [place] [address]  Chief complaint: Follow-up for recurrent pneumonia  HPI: 78 y.o. who  has a past medical history of Cataract, Depression, Inguinal hernia, and Umbilical hernia.   Discussed the use of AI scribe software for clinical note transcription with the patient, who gave verbal consent to proceed.  George Meneely. is a 78 year old male who presents with recurrent pneumonia. He was referred by Dr. Stefani Edin for further evaluation of his recurrent pneumonia.  He has experienced recurrent episodes of pneumonia, with hospitalizations in 2020/12/11and October 2024. He also had pneumonia in 2024-01-11concurrent with the flu, but was not hospitalized at that time. During his most recent hospitalization in October 2024, he was treated with IV ceftriaxone  and azithromycin . COVID-19 and flu tests were negative during this episode.  He has persistent cold-like symptoms but feels okay currently. He has not used inhalers since recovering from the pneumonia in October 2024, although he has albuterol  available. No known exposure to mold, hot tubs, Jacuzzis, feather pillows, or comforters at home. No recent travel and no family history of lung disease.  He quit smoking in 1975 after smoking for about 10 years, approximately two packs a day. 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 He stopped working after his wife became ill in 2022/12/11and passed away in 07-12-2021. He now spends most of his time with his grandchildren, aged 60 and eleven.   Pets: No pets Occupation: Retired Paramedic Exposures: No mold, hot tub, Financial controller.  No feather pillows or comforters Smoking history: 20-pack-year smoker.  Quit in 1975 Travel history: No significant travel history Relevant  family history: Family history of lung disease  Interim history: Discussed the use of AI scribe software for clinical note transcription with the patient, who gave verbal consent to proceed.  History of Present Illness George Paul. is a 78 year old male with recurrent pneumonia who presents for follow-up after a CT scan.  He has a history of recurrent pneumonia, with a recent CT scan performed in February for follow-up. He currently feels fine with no breathing issues.  Due to his smoking history, he underwent lung function tests. He experiences shortness of breath only during strenuous activities and does not experience wheezing. He attributes the shortness of breath to a lack of exercise.  He has a history of using albuterol  inhalers during previous pneumonia episodes but has not used them recently.   Outpatient Encounter Medications as of 11/22/2023  Medication Sig   Eszopiclone 3 MG TABS Take 3 mg by mouth at bedtime. Take immediately before bedtime   mirtazapine  (REMERON ) 15 MG tablet Take 15 mg by mouth at bedtime.   Multiple Vitamins-Minerals (AIRBORNE PO) Take 1 tablet by mouth in the morning.   psyllium (HYDROCIL/METAMUCIL) 95 % PACK Take 1 packet by mouth daily.   albuterol  (VENTOLIN  HFA) 108 (90 Base) MCG/ACT inhaler Inhale 2 puffs into the lungs every 6 (six) hours as needed for wheezing or shortness of breath.   Cholecalciferol (VITAMIN D-3 PO) Take 1 tablet by mouth in the morning and at bedtime. Unknown strength   dextromethorphan  (DELSYM ) 30 MG/5ML liquid Take 5 mLs (30 mg total) by mouth 2 (two) times daily as  needed for cough.   guaiFENesin  (MUCINEX ) 600 MG 12 hr tablet Take 1 tablet (600 mg total) by mouth 2 (two) times daily.   Ipratropium-Albuterol  (COMBIVENT ) 20-100 MCG/ACT AERS respimat Inhale 1 puff into the lungs every 6 (six) hours.   Omega-3 1000 MG CAPS Take 1 capsule by mouth in the morning and at bedtime.   No facility-administered encounter  medications on file as of 11/22/2023.    Physical Exam: Blood pressure 133/63, pulse 68, height 5\' 11"  (1.803 m), weight 190 lb 3.2 oz (86.3 kg), SpO2 99%. Gen:      No acute distress HEENT:  EOMI, sclera anicteric Neck:     No masses; no thyromegaly Lungs:    Clear to auscultation bilaterally; normal respiratory effort CV:         Regular rate and rhythm; no murmurs Abd:      + bowel sounds; soft, non-tender; no palpable masses, no distension Ext:    No edema; adequate peripheral perfusion Neuro: alert and oriented x 3 Psych: normal mood and affect   Data Reviewed: Imaging: CT chest 06/23/2019-space disease in the right lower lobe with groundglass opacities, peribronchovascular thickening, right hilar, subcarinal adenopathy  CT chest 04/26/2023-peribronchial thickening, airspace disease worsening compared to 2020.  CT chest 09/11/2023-no evidence of interstitial lung disease, resolution of right lower lobe pneumonia, mild cylindrical bronchiectasis in the right lower lobe I reviewed the images personally  PFTs: 11/22/2023 FVC 4.29 [98%], FEV1 3.05 [96%], F/F71, TLC 7.20 [9 9%], DLCO 24.54 [95%] Normal pulmonary function, bronchodilator response  Labs: Assessment & Plan Recurrent pneumonia Recurrent pneumonia with episodes in January 2024, October 2024, and December 2020. Hospitalized for October 2024 and December 2020 episodes. Treated with IV ceftriaxone  and azithromycin . Negative for COVID-19 and flu during October 2024 episode. No known episodes of aspiration or significant environmental exposures  Follow-up CT scan shows resolution of pneumonia with mild residual bronchiectasis.  There are no airway abnormalities causing any postobstructive issues.  Lung function tests are normal, indicating no current need for further intervention.  Possible asthma Lung function tests show changes consistent with reactive airway disease or asthma, but baseline lung function is normal. No  current symptoms of wheezing or dyspnea. Inhaler use not required at this time. - Use albuterol  inhaler if symptoms such as wheezing or dyspnea develop.  Recommendations: Follow-up as needed  Jaleya Pebley MD [facility] 11/22/2023, 11:17 AM  CC: Rae Bugler, MD

## 2023-11-22 NOTE — Patient Instructions (Signed)
 VISIT SUMMARY:  You came in for a follow-up appointment after your recent CT scan. You have a history of recurrent pneumonia, but you are currently feeling fine with no breathing issues. We discussed the results of your CT scan and lung function tests.  YOUR PLAN:  -BRONCHIECTASIS: Bronchiectasis is a condition where the lung's airways become damaged, making it hard to clear mucus. Your CT scan shows residual scarring from previous pneumonia, but no new abnormalities. No follow-up CT scan is needed at this time.  -RECURRENT PNEUMONIA: Recurrent pneumonia means having multiple episodes of pneumonia over time. Your CT scan shows that your pneumonia has resolved, and your lung function tests are normal. No further intervention is needed right now.  -POSSIBLE ASTHMA: Asthma is a condition where your airways narrow and swell, which can make breathing difficult. Your lung function tests show changes that could indicate asthma, but your baseline lung function is normal and you have no current symptoms. You do not need to use an inhaler unless you develop symptoms like wheezing or shortness of breath.  INSTRUCTIONS:  If you develop symptoms such as wheezing or shortness of breath, use your albuterol  inhaler as needed. No follow-up CT scan is required at this time.  Follow-up in pulmonary clinic as needed

## 2024-06-23 ENCOUNTER — Encounter (INDEPENDENT_AMBULATORY_CARE_PROVIDER_SITE_OTHER): Payer: Federal, State, Local not specified - PPO | Admitting: Ophthalmology

## 2024-06-23 DIAGNOSIS — H353131 Nonexudative age-related macular degeneration, bilateral, early dry stage: Secondary | ICD-10-CM | POA: Diagnosis not present

## 2024-06-23 DIAGNOSIS — H43813 Vitreous degeneration, bilateral: Secondary | ICD-10-CM | POA: Diagnosis not present

## 2025-06-23 ENCOUNTER — Encounter (INDEPENDENT_AMBULATORY_CARE_PROVIDER_SITE_OTHER): Admitting: Ophthalmology
